# Patient Record
Sex: Male | Born: 1985 | Race: Black or African American | Hispanic: No | Marital: Single | Smoking: Current every day smoker
Health system: Southern US, Community
[De-identification: ages and names within clinical notes are randomized; demographics above are authoritative.]

## PROBLEM LIST (undated history)

## (undated) DIAGNOSIS — I10 Essential (primary) hypertension: Secondary | ICD-10-CM

## (undated) DIAGNOSIS — N19 Unspecified kidney failure: Secondary | ICD-10-CM

---

## 2014-11-13 ENCOUNTER — Emergency Department (HOSPITAL_COMMUNITY): Payer: Medicaid Other

## 2014-11-13 ENCOUNTER — Inpatient Hospital Stay (HOSPITAL_COMMUNITY)
Admission: EM | Admit: 2014-11-13 | Discharge: 2014-11-13 | DRG: 682 | Discharge status: Against Medical Advice | Payer: Medicaid Other | Attending: Family Medicine | Admitting: Family Medicine

## 2014-11-13 ENCOUNTER — Encounter (HOSPITAL_COMMUNITY): Payer: Self-pay | Admitting: *Deleted

## 2014-11-13 DIAGNOSIS — I209 Angina pectoris, unspecified: Secondary | ICD-10-CM

## 2014-11-13 DIAGNOSIS — E871 Hypo-osmolality and hyponatremia: Secondary | ICD-10-CM | POA: Diagnosis not present

## 2014-11-13 DIAGNOSIS — N186 End stage renal disease: Secondary | ICD-10-CM | POA: Diagnosis present

## 2014-11-13 DIAGNOSIS — E875 Hyperkalemia: Secondary | ICD-10-CM | POA: Diagnosis present

## 2014-11-13 DIAGNOSIS — N189 Chronic kidney disease, unspecified: Secondary | ICD-10-CM

## 2014-11-13 DIAGNOSIS — F17210 Nicotine dependence, cigarettes, uncomplicated: Secondary | ICD-10-CM | POA: Diagnosis present

## 2014-11-13 DIAGNOSIS — Z9115 Patient's noncompliance with renal dialysis: Secondary | ICD-10-CM | POA: Diagnosis present

## 2014-11-13 DIAGNOSIS — E8779 Other fluid overload: Secondary | ICD-10-CM

## 2014-11-13 DIAGNOSIS — I120 Hypertensive chronic kidney disease with stage 5 chronic kidney disease or end stage renal disease: Principal | ICD-10-CM | POA: Diagnosis present

## 2014-11-13 DIAGNOSIS — Q261 Persistent left superior vena cava: Secondary | ICD-10-CM | POA: Diagnosis not present

## 2014-11-13 DIAGNOSIS — Z992 Dependence on renal dialysis: Secondary | ICD-10-CM | POA: Diagnosis not present

## 2014-11-13 DIAGNOSIS — N19 Unspecified kidney failure: Secondary | ICD-10-CM

## 2014-11-13 DIAGNOSIS — D649 Anemia, unspecified: Secondary | ICD-10-CM

## 2014-11-13 HISTORY — DX: Unspecified kidney failure: N19

## 2014-11-13 HISTORY — DX: Essential (primary) hypertension: I10

## 2014-11-13 LAB — HEPATIC FUNCTION PANEL
ALBUMIN: 3.6 g/dL (ref 3.5–5.2)
ALT: 19 U/L (ref 0–53)
AST: 25 U/L (ref 0–37)
Alkaline Phosphatase: 1120 U/L — ABNORMAL HIGH (ref 39–117)
BILIRUBIN TOTAL: 0.8 mg/dL (ref 0.3–1.2)
Bilirubin, Direct: 0.1 mg/dL (ref 0.0–0.5)
Indirect Bilirubin: 0.7 mg/dL (ref 0.3–0.9)
Total Protein: 7.3 g/dL (ref 6.0–8.3)

## 2014-11-13 LAB — BRAIN NATRIURETIC PEPTIDE: B Natriuretic Peptide: 3040.1 pg/mL — ABNORMAL HIGH (ref 0.0–100.0)

## 2014-11-13 LAB — BASIC METABOLIC PANEL
Anion gap: 23 — ABNORMAL HIGH (ref 5–15)
BUN: 131 mg/dL — ABNORMAL HIGH (ref 6–23)
CO2: 17 mmol/L — AB (ref 19–32)
Calcium: 9.2 mg/dL (ref 8.4–10.5)
Chloride: 98 mmol/L (ref 96–112)
Creatinine, Ser: 20.97 mg/dL — ABNORMAL HIGH (ref 0.50–1.35)
GFR calc Af Amer: 3 mL/min — ABNORMAL LOW (ref >90)
GFR calc non Af Amer: 2 mL/min — ABNORMAL LOW (ref >90)
GLUCOSE: 93 mg/dL (ref 70–99)
Sodium: 138 mmol/L (ref 135–145)

## 2014-11-13 LAB — I-STAT TROPONIN, ED: Troponin i, poc: 0.09 ng/mL (ref 0.00–0.08)

## 2014-11-13 LAB — CREATININE, SERUM
CREATININE: 14.48 mg/dL — AB (ref 0.50–1.35)
GFR calc Af Amer: 5 mL/min — ABNORMAL LOW (ref >90)
GFR calc non Af Amer: 4 mL/min — ABNORMAL LOW (ref >90)

## 2014-11-13 LAB — PHOSPHORUS
Phosphorus: 9.1 mg/dL — ABNORMAL HIGH (ref 2.3–4.6)
Phosphorus: 6.5 mg/dL — ABNORMAL HIGH (ref 2.3–4.6)

## 2014-11-13 LAB — I-STAT CHEM 8, ED
BUN: 91 mg/dL — AB (ref 6–23)
CREATININE: 15.5 mg/dL — AB (ref 0.50–1.35)
Calcium, Ion: 1.07 mmol/L — ABNORMAL LOW (ref 1.12–1.23)
Chloride: 99 mmol/L (ref 96–112)
GLUCOSE: 111 mg/dL — AB (ref 70–99)
HCT: 30 % — ABNORMAL LOW (ref 39.0–52.0)
Hemoglobin: 10.2 g/dL — ABNORMAL LOW (ref 13.0–17.0)
Potassium: 4 mmol/L (ref 3.5–5.1)
Sodium: 137 mmol/L (ref 135–145)
TCO2: 18 mmol/L (ref 0–100)

## 2014-11-13 LAB — MAGNESIUM
MAGNESIUM: 2.3 mg/dL (ref 1.5–2.5)
Magnesium: 2 mg/dL (ref 1.5–2.5)

## 2014-11-13 LAB — CBC
HCT: 29.8 % — ABNORMAL LOW (ref 39.0–52.0)
HCT: 25.3 % — ABNORMAL LOW (ref 39.0–52.0)
HEMOGLOBIN: 9.4 g/dL — AB (ref 13.0–17.0)
Hemoglobin: 8.3 g/dL — ABNORMAL LOW (ref 13.0–17.0)
MCH: 30.6 pg (ref 26.0–34.0)
MCH: 30.3 pg (ref 26.0–34.0)
MCHC: 31.5 g/dL (ref 30.0–36.0)
MCHC: 32.8 g/dL (ref 30.0–36.0)
MCV: 97.1 fL (ref 78.0–100.0)
MCV: 92.3 fL (ref 78.0–100.0)
PLATELETS: 122 10*3/uL — AB (ref 150–400)
Platelets: 131 10*3/uL — ABNORMAL LOW (ref 150–400)
RBC: 3.07 MIL/uL — AB (ref 4.22–5.81)
RBC: 2.74 MIL/uL — ABNORMAL LOW (ref 4.22–5.81)
RDW: 15.6 % — ABNORMAL HIGH (ref 11.5–15.5)
RDW: 15.6 % — ABNORMAL HIGH (ref 11.5–15.5)
WBC: 5.1 10*3/uL (ref 4.0–10.5)
WBC: 4.2 10*3/uL (ref 4.0–10.5)

## 2014-11-13 LAB — HEPATITIS B SURFACE ANTIGEN: HEP B S AG: NEGATIVE

## 2014-11-13 MED ORDER — LIDOCAINE HCL (PF) 1 % IJ SOLN: 5.0000 mL | INTRAMUSCULAR | Status: DC | PRN

## 2014-11-13 MED ORDER — HEPARIN SODIUM (PORCINE) 1000 UNIT/ML DIALYSIS: 20.0000 [IU]/kg | INTRAMUSCULAR | Status: DC | PRN

## 2014-11-13 MED ORDER — PENTAFLUOROPROP-TETRAFLUOROETH EX AERO: 1.0000 "application " | INHALATION_SPRAY | CUTANEOUS | Status: DC | PRN

## 2014-11-13 MED ORDER — DIPHENHYDRAMINE HCL 25 MG PO CAPS
25.0000 mg | ORAL_CAPSULE | Freq: Four times a day (QID) | ORAL | Status: DC | PRN
Start: 2014-11-13 — End: 2014-11-14
  Filled 2014-11-13: qty 1

## 2014-11-13 MED ORDER — MORPHINE SULFATE 4 MG/ML IJ SOLN
4.0000 mg | Freq: Once | INTRAMUSCULAR | Status: AC
Start: 2014-11-13 — End: 2014-11-13
  Administered 2014-11-13: 4 mg via INTRAVENOUS
  Filled 2014-11-13: qty 1

## 2014-11-13 MED ORDER — LIDOCAINE-PRILOCAINE 2.5-2.5 % EX CREA: 1.0000 "application " | TOPICAL_CREAM | CUTANEOUS | Status: DC | PRN

## 2014-11-13 MED ORDER — ALBUTEROL SULFATE (2.5 MG/3ML) 0.083% IN NEBU
10.0000 mg | INHALATION_SOLUTION | Freq: Once | RESPIRATORY_TRACT | Status: AC
  Administered 2014-11-13: 10 mg via RESPIRATORY_TRACT
  Filled 2014-11-13: qty 12

## 2014-11-13 MED ORDER — DIPHENHYDRAMINE HCL 50 MG/ML IJ SOLN
25.0000 mg | Freq: Once | INTRAMUSCULAR | Status: AC
  Administered 2014-11-13: 25 mg via INTRAVENOUS
  Filled 2014-11-13: qty 1

## 2014-11-13 MED ORDER — SODIUM POLYSTYRENE SULFONATE 15 GM/60ML PO SUSP
30.0000 g | Freq: Once | ORAL | Status: AC
  Administered 2014-11-13: 30 g via ORAL
  Filled 2014-11-13: qty 120

## 2014-11-13 MED ORDER — ASPIRIN 325 MG PO TABS
325.0000 mg | ORAL_TABLET | Freq: Once | ORAL | Status: AC
  Administered 2014-11-13: 325 mg via ORAL
  Filled 2014-11-13: qty 1

## 2014-11-13 MED ORDER — SODIUM CHLORIDE 0.9 % IJ SOLN: 3.0000 mL | Freq: Two times a day (BID) | INTRAMUSCULAR | Status: DC

## 2014-11-13 MED ORDER — SODIUM CHLORIDE 0.9 % IV SOLN: 100.0000 mL | INTRAVENOUS | Status: DC | PRN

## 2014-11-13 MED ORDER — SODIUM CHLORIDE 0.9 % IV SOLN: 250.0000 mL | INTRAVENOUS | Status: DC | PRN

## 2014-11-13 MED ORDER — ONDANSETRON HCL 4 MG/2ML IJ SOLN
4.0000 mg | Freq: Once | INTRAMUSCULAR | Status: AC
  Administered 2014-11-13: 4 mg via INTRAVENOUS
  Filled 2014-11-13: qty 2

## 2014-11-13 MED ORDER — CALCIUM GLUCONATE 10 % IV SOLN: 1.0000 g | Freq: Once | INTRAVENOUS | Status: DC

## 2014-11-13 MED ORDER — HEPARIN SODIUM (PORCINE) 1000 UNIT/ML DIALYSIS: 1000.0000 [IU] | INTRAMUSCULAR | Status: DC | PRN

## 2014-11-13 MED ORDER — SODIUM CHLORIDE 0.9 % IV SOLN
1.0000 g | Freq: Once | INTRAVENOUS | Status: AC
  Administered 2014-11-13: 1 g via INTRAVENOUS
  Filled 2014-11-13: qty 10

## 2014-11-13 MED ORDER — NEPRO/CARBSTEADY PO LIQD: 237.0000 mL | ORAL | Status: DC | PRN

## 2014-11-13 MED ORDER — SODIUM CHLORIDE 0.9 % IJ SOLN: 3.0000 mL | INTRAMUSCULAR | Status: DC | PRN

## 2014-11-13 MED ORDER — INSULIN ASPART 100 UNIT/ML IV SOLN
10.0000 [IU] | Freq: Once | INTRAVENOUS | Status: AC
  Administered 2014-11-13: 10 [IU] via INTRAVENOUS
  Filled 2014-11-13: qty 0.1

## 2014-11-13 MED ORDER — SODIUM CHLORIDE 0.9 % IJ SOLN
3.0000 mL | Freq: Two times a day (BID) | INTRAMUSCULAR | Status: DC
  Administered 2014-11-13: 3 mL via INTRAVENOUS

## 2014-11-13 MED ORDER — DIPHENHYDRAMINE HCL 50 MG/ML IJ SOLN
12.5000 mg | Freq: Once | INTRAMUSCULAR | Status: AC
  Administered 2014-11-13: 12.5 mg via INTRAVENOUS
  Filled 2014-11-13: qty 1

## 2014-11-13 MED ORDER — DEXTROSE 50 % IV SOLN
1.0000 | Freq: Once | INTRAVENOUS | Status: AC
  Administered 2014-11-13: 50 mL via INTRAVENOUS
  Filled 2014-11-13: qty 50

## 2014-11-13 MED ORDER — HEPARIN SODIUM (PORCINE) 5000 UNIT/ML IJ SOLN
5000.0000 [IU] | Freq: Three times a day (TID) | INTRAMUSCULAR | Status: DC
  Filled 2014-11-13: qty 1

## 2014-11-13 NOTE — ED Notes (Signed)
Pt requesting something for itching. Made Pfeiffer MD aware. EDP explained that she is waiting to hear back from Nephrology.   Informed pt of delay in ordering medication.

## 2014-11-13 NOTE — ED Notes (Signed)
Pt reports he dialysis pt x16 years. Last dialysis was Jan 30, pt is suppose to go every Tues, Thurs, and Saturday. Pt does not have reason why he has not been going. Chest pain, cough, and headache x3 days. Pain 10/10. Able to speak in full sentences.

## 2014-11-13 NOTE — ED Notes (Signed)
Pt. Transferred [facility] via [facility]. Uneventful. Pt. Here for dialysis and admission. Pt. Has elevated trop. Of 0.9, potassium. [facility] gave 4 mg morphine iv and zofran iv. Pain level is now tolerable. See [facility] ed notes.

## 2014-11-13 NOTE — Consult Note (Signed)
[place] Elsie Amis 11/13/2014 Arita Miss Requesting Physician:  Cena Benton MD  Reason for Consult:  ESRD, Hyperkalemia, Volume Overload HPI:  29 year old male seen at the request of Dr. Cena Benton for hyperkalemia and this dialysis. He previously was living in [place], [place], and chose to move to [place]. He moved over a week and a half ago and made no plans for transition of hemodialysis care whatsoever. When asked why this took place he has no explanation. He presented to the emergency room and [facility] today complaining of malaise and swelling stating that he "needed dialysis".  He was medically treated for the hyperkalemia which was greater than 7.5 and transferred to the [facility] ER for dialysis.  Treatment records for his outpatient dialysis are not available. He provides very little information. He uses a left upper arm AV fistula. When asked if he plans to stay in [place] he's tells me that he may do it for a year or so.    Filed Weights   11/13/14 0906  Weight: 66 kg (145 lb 8.1 oz)       ROS Balance of 12 systems is negative w/ exceptions as above  Outpt HD Orders Unavailable currently, requested  PMH  Past Medical History  Diagnosis Date  . Dialysis patient   . Hypertension    PSH History reviewed. No pertinent past surgical history. FH History reviewed. No pertinent family history. SH  reports that he has been smoking Cigarettes.  He has a 5 pack-year smoking history. He does not have any smokeless tobacco history on file. He reports that he does not drink alcohol or use illicit drugs. Allergies No Known Allergies Home medications Prior to Admission medications   Medication Sig Start Date End Date Taking? Authorizing Provider  carvedilol (COREG) 25 MG tablet Take 25 mg by mouth 2 (two) times daily with a meal.   Yes Historical Provider, MD  cinacalcet (SENSIPAR) 60 MG tablet Take 60 mg by mouth daily.   Yes Historical Provider, MD  famotidine  (PEPCID) 20 MG tablet Take 20 mg by mouth daily.    Yes Historical Provider, MD  hydrALAZINE (APRESOLINE) 50 MG tablet Take 50 mg by mouth 2 (two) times daily.   Yes Historical Provider, MD  isosorbide dinitrate (ISORDIL) 40 MG tablet Take 40 mg by mouth 2 (two) times daily.   Yes Historical Provider, MD  metoCLOPramide (REGLAN) 5 MG tablet Take 5 mg by mouth 4 (four) times daily -  before meals and at bedtime.   Yes Historical Provider, MD  NIFEdipine (PROCARDIA XL/ADALAT-CC) 60 MG 24 hr tablet Take 60 mg by mouth daily.   Yes Historical Provider, MD  sevelamer (RENAGEL) 800 MG tablet Take 800 mg by mouth 3 (three) times daily with meals.   Yes Historical Provider, MD    Current Medications Scheduled Meds: Continuous Infusions: PRN Meds:.  CBC  Recent Labs Lab 11/13/14 0931  WBC 5.1  HGB 9.4*  HCT 29.8*  MCV 97.1  PLT 131*   Basic Metabolic Panel  Recent Labs Lab 11/13/14 0931 11/13/14 1016  NA 138  --   K >7.5*  --   CL 98  --   CO2 17*  --   GLUCOSE 93  --   BUN 131*  --   CREATININE 20.97*  --   CALCIUM 9.2  --   PHOS  --  9.1*    Physical Exam  Blood pressure 175/92, pulse 94, temperature 97.3 F (36.3 C), temperature source Oral,  resp. rate 23, weight 66 kg (145 lb 8.1 oz), SpO2 100 %. GEN: Mild distress, NRB in palce ENT: NCAT EYES: EOMI, periorbital edema CV: RRR, no rub PULM: coarse bs b/l ABD: s/nt/nd SKIN: no rashes/lesions EXT:2+ pitting LEE LUE AVF + B/T   A 1. ESRD with Uremia 2. HTN and Volume Overload 3. Nonadherence to HD 4. Anemia 5. MBD / 2HPTH / Hyperphosphatemia  P 1. Emergent HD today for volume and hyperkalemia 2. Only 3h Tx given recent missed Tx and concern for dysequilibrium, but need to lower K and remove fluid 3. Need to restart BMD and Anemia management 4. Looks like will need outpt HD in [place], will work to arrange.  Sabra Heckyan [place] MD 11/13/2014, 1:40 PM

## 2014-11-13 NOTE — ED Provider Notes (Signed)
CSN: [id number]     Arrival date & time 11/13/14  0855 History   First MD Initiated Contact with Patient 11/13/14 [phone]     Chief Complaint  Patient presents with  . dialysis pt, chest pain   . Headache     (Consider location/radiation/quality/duration/timing/severity/associated sxs/prior Treatment) HPI The patient reports that he has missed approximately 8 dialysis treatments since January 30. He reports he recently moved to this area and has not established care here. He reports his from [facility] [place] and his nephrologist is a Dr. Gretchen Shorted Phillips. He has been becoming increasingly short of breath. The patient reports that he has developed swelling of his legs, arms and head. He reports he is itching a lot because his phosphorus is elevated and he has a generalized throbbing headache. He reports since yesterday he has developed some constant chest pressure which she describes as being "like the fluid building up". He denies he has had a fever, nausea or vomiting. He has not had syncope or cough. He reports he has not taken any of his medications since the end of January and that he is on many medications. Past Medical History  Diagnosis Date  . Dialysis patient   . Hypertension    History reviewed. No pertinent past surgical history. History reviewed. No pertinent family history. History  Substance Use Topics  . Smoking status: Current Every Day Smoker -- 1.00 packs/day for 5 years    Types: Cigarettes  . Smokeless tobacco: Not on file  . Alcohol Use: No    Review of Systems  10 Systems reviewed and are negative for acute change except as noted in the HPI.   Allergies  Review of patient's allergies indicates no known allergies.  Home Medications   Prior to Admission medications   Medication Sig Start Date End Date Taking? Authorizing Provider  carvedilol (COREG) 25 MG tablet Take 25 mg by mouth 2 (two) times daily with a meal.   Yes Historical Provider, MD  cinacalcet  (SENSIPAR) 60 MG tablet Take 60 mg by mouth daily.   Yes Historical Provider, MD  famotidine (PEPCID) 20 MG tablet Take 20 mg by mouth daily.    Yes Historical Provider, MD  hydrALAZINE (APRESOLINE) 50 MG tablet Take 50 mg by mouth 2 (two) times daily.   Yes Historical Provider, MD  isosorbide dinitrate (ISORDIL) 40 MG tablet Take 40 mg by mouth 2 (two) times daily.   Yes Historical Provider, MD  metoCLOPramide (REGLAN) 5 MG tablet Take 5 mg by mouth 4 (four) times daily -  before meals and at bedtime.   Yes Historical Provider, MD  NIFEdipine (PROCARDIA XL/ADALAT-CC) 60 MG 24 hr tablet Take 60 mg by mouth daily.   Yes Historical Provider, MD  sevelamer (RENAGEL) 800 MG tablet Take 800 mg by mouth 3 (three) times daily with meals.   Yes Historical Provider, MD   BP 172/94 mmHg  Pulse 86  Temp(Src) 98.3 F (36.8 C) (Oral)  Resp 22  Wt 145 lb 8.1 oz (66 kg)  SpO2 95% Physical Exam  Constitutional: He is oriented to person, place, and time. He appears well-developed and well-nourished.  The patient appears mild to moderately short of breath. His mental status is clear. He has a generally edematous appearance. His skin is warm and dry.  HENT:  The patient's head appears generally edematous with puffy edema around his eyes lower face and neck. Examination and palpation of the neck show many dilated veins. The internal oral cavity  is widely patent the tongue does not have appearance of angioedema.  Eyes: EOM are normal. Pupils are equal, round, and reactive to light. Right eye exhibits no discharge. Left eye exhibits no discharge.  Cardiovascular:  Tachycardic. Distant heart sounds.  Pulmonary/Chest:  Mild to moderate work of breathing. Adequate air flow to both lung fields. Occasional expiratory wheeze. Minimal fine rale appreciated  Abdominal: He exhibits distension.  The patient's abdomen is mildly distended and firm. There is are no peritoneal signs present. No definitively appreciable mass.   Musculoskeletal:  Left upper extremity has the patient's fistula. There is a area with thrill and pulse present however is not a largely dilated vessel. There are many firm thrombosed feeling veins. There is 2+ edema of the left upper extremity with pitting on the dorsum of the hand. Bilateral lower extremities have significant skin thinning but no active wounds and 1+ pitting edema.  Neurological: He is alert and oriented to person, place, and time. No cranial nerve deficit. Coordination normal.  Skin: Skin is warm and dry.  Psychiatric: He has a normal mood and affect.    ED Course  Procedures (including critical care time) Labs Review Labs Reviewed  CBC - Abnormal; Notable for the following:    RBC 3.07 (*)    Hemoglobin 9.4 (*)    HCT 29.8 (*)    RDW 15.6 (*)    Platelets 131 (*)    All other components within normal limits  BASIC METABOLIC PANEL - Abnormal; Notable for the following:    Potassium >7.5 (*)    CO2 17 (*)    BUN 131 (*)    Creatinine, Ser 20.97 (*)    GFR calc non Af Amer 2 (*)    GFR calc Af Amer 3 (*)    Anion gap 23 (*)    All other components within normal limits  BRAIN NATRIURETIC PEPTIDE - Abnormal; Notable for the following:    B Natriuretic Peptide 3040.1 (*)    All other components within normal limits  PHOSPHORUS - Abnormal; Notable for the following:    Phosphorus 9.1 (*)    All other components within normal limits  HEPATIC FUNCTION PANEL - Abnormal; Notable for the following:    Alkaline Phosphatase 1120 (*)    All other components within normal limits  I-STAT TROPOININ, ED - Abnormal; Notable for the following:    Troponin i, poc 0.09 (*)    All other components within normal limits  MAGNESIUM    Imaging Review Dg Chest Port 1 View  11/13/2014   CLINICAL DATA:  Chest pain and cough.  Dyspnea.  EXAM: PORTABLE CHEST - 1 VIEW  COMPARISON:  None.  FINDINGS: There is cardiomegaly. There is slight pulmonary vascular congestion. Stents in the  left innominate vein and left subclavian vein.  There is slight ground-glass haziness in the right lung. No effusions. No acute osseous abnormalities.  IMPRESSION: 1. Cardiomegaly with slight pulmonary vascular congestion. 2. Slight diffuse haziness in the right lung which could represent pneumonitis or pulmonary edema.   Electronically Signed   By: Francene Boyers M.D.   On: 11/13/2014 10:30     EKG Interpretation None     Consult Dr. Patience Musca [address], [place], [place] [address]. I was able to speak with the patient's primary nephrologist in [facility]. He reports that that the patient has extremely poor compliance and only comes to a portion of his scheduled dialysis appointments and usually will not stay more  than 2 hours at a time. He does not take his prescribed medications. By report he has been set up with all needed health care including free medications, dialysis and living arrangements. Dr. Vear Clock reports that family members have tried to be supportive and helpful as well, however the patient continues with severe noncompliance. He reports that the large facial swelling observed is chronic in nature due to long-term fluid overload. He reports that at times his overload become so severe that the patient cannot open his eyes due to the swelling. He reports the patient has severe and advanced disease with high risk of sudden death at any time particularly in light of severe noncompliance.  Consult: Dr. Arlean Hopping nephrology. Agrees with current treatment for hyperkalemia request to add oral Kayexalate. The patient is to be transferred for emergent dialysis.  CRITICAL CARE Performed by: Arby Barrette   Total critical care time: 45  Critical care time was exclusive of separately billable procedures and treating other patients.  Critical care was necessary to treat or prevent imminent or life-threatening deterioration.  Critical care was time spent personally by me on the  following activities: development of treatment plan with patient and/or surrogate as well as nursing, discussions with consultants, evaluation of patient's response to treatment, examination of patient, obtaining history from patient or surrogate, ordering and performing treatments and interventions, ordering and review of laboratory studies, ordering and review of radiographic studies, pulse oximetry and re-evaluation of patient's condition. MDM   Final diagnoses:  Chronic renal failure, unspecified stage  Other hypervolemia  Ischemic chest pain  Hyperkalemia   The patient presents as outlined above with fluid overload and hyperkalemia secondary to chronic renal failure with missed dialysis appointments and failure to take any regular medications. He will be transferred to [facility] for emergent dialysis and ongoing treatment.    Arby Barrette, MD 11/13/14 1128

## 2014-11-13 NOTE — Progress Notes (Signed)
At 1445 pt ISTAT k+ result was 4.0.  Pt. Put on a 2K bath.

## 2014-11-13 NOTE — ED Notes (Signed)
Made respiratory aware of neb treatment ordered 

## 2014-11-13 NOTE — ED Notes (Signed)
Admitting md at bedside

## 2014-11-13 NOTE — H&P (Signed)
[facility] History and Physical  Alexander RiggRichard Norlander ZOX:[id number] DOB: 05/08/1986 DOA: 11/13/2014  Referring physician: Dr Donnald GarrePfeiffer PCP: No PCP Per Patient   Chief Complaint: "I need dialysis"  HPI: Alexander [name] is a 29 y.o. male  With history of end-stage renal disease on hemodialysis. Who presents to the ED stating that he needs dialysis. Reportedly patient lives in a different town in [place] and recently moved up here tomorrow where he is currently not set up to obtain dialysis. When asked why he missed dialysis he said no reason in particular. He is complaining of shortness of breath.    While in the ED patient was found to be fluid overloaded and workup would reveal an elevated potassium level of more than 7.5. The patient was given calcium gluconate, albuterol, and insulin. Nephrology was consulted by ED and we were requested to admit the patient to [facility] for further evaluation recommendations.   Review of Systems:  Constitutional:  No weight loss, night sweats, Fevers, chills, fatigue.  HEENT:  No headaches, Difficulty swallowing,Tooth/dental problems,Sore throat,  No sneezing, itching, ear ache, nasal congestion, post nasal drip,  Cardio-vascular:  No chest pain, Orthopnea, PND, swelling in lower extremities, anasarca, dizziness, palpitations  GI:  No heartburn, indigestion, abdominal pain, nausea, vomiting, diarrhea, change in bowel habits, loss of appetite  Resp:  + shortness of breath with exertion or at rest. No excess mucus, no productive cough,+ non-productive cough, No coughing up of blood.No change in color of mucus.No wheezing.No chest wall deformity  Skin:  no rash or lesions.  GU:  no dysuria, change in color of urine, no urgency or frequency. No flank pain.  Musculoskeletal:  No joint pain or swelling. No decreased range of motion. No back pain.  Psych:  No change in mood or affect. No depression or anxiety. No memory loss.   Past Medical  History  Diagnosis Date  . Dialysis patient   . Hypertension    History reviewed. past surgical history: non reported when asked directly. Social History:  reports that he has been smoking Cigarettes.  He has a 5 pack-year smoking history. He does not have any smokeless tobacco history on file. He reports that he does not drink alcohol or use illicit drugs.  No Known Allergies  FAmily history  - non reported when asked directly.  Prior to Admission medications   Not on File   Physical Exam: Filed Vitals:   11/13/14 0906 11/13/14 0914 11/13/14 0915 11/13/14 0942  BP: 172/108 160/96 172/94   Pulse: 90  69 86  Temp: 98.3 F (36.8 C)     TempSrc: Oral     Resp: 16 19 23 22   Weight: 66 kg (145 lb 8.1 oz)     SpO2: 95%   95%    Wt Readings from Last 3 Encounters:  11/13/14 66 kg (145 lb 8.1 oz)    General:  Appears calm and comfortable Eyes: PERRL, normal lids, irises & conjunctiva ENT: grossly normal hearing, lips & tongue Neck: no masses or thyromegaly Cardiovascular: RRR, no m/r/g. + edema Telemetry: SR, no arrhythmias  Respiratory: increased wob, speaks in broken sentences, no wheezes, rhales,  in place Abdomen: soft, nt, nd Skin: no rash or induration seen on limited exam Musculoskeletal: grossly normal tone BUE/BLE Psychiatric: grossly normal mood and affect, speech fluent and appropriate Neurologic: answers questions appropriately moves extremities equally.          Labs on Admission:  Basic Metabolic Panel:  Recent Labs  Lab 11/13/14 0931  NA 138  K >7.5*  CL 98  CO2 17*  GLUCOSE 93  BUN 131*  CREATININE 20.97*  CALCIUM 9.2   Liver Function Tests: No results for input(s): AST, ALT, ALKPHOS, BILITOT, PROT, ALBUMIN in the last 168 hours. No results for input(s): LIPASE, AMYLASE in the last 168 hours. No results for input(s): AMMONIA in the last 168 hours. CBC:  Recent Labs Lab 11/13/14 0931  WBC 5.1  HGB 9.4*  HCT 29.8*  MCV 97.1  PLT 131*     Cardiac Enzymes: No results for input(s): CKTOTAL, CKMB, CKMBINDEX, TROPONINI in the last 168 hours.  BNP (last 3 results)  Recent Labs  11/13/14 0931  BNP 3040.1*    ProBNP (last 3 results) No results for input(s): PROBNP in the last 8760 hours.  CBG: No results for input(s): GLUCAP in the last 168 hours.  Radiological Exams on Admission: Dg Chest Port 1 View  11/13/2014   CLINICAL DATA:  Chest pain and cough.  Dyspnea.  EXAM: PORTABLE CHEST - 1 VIEW  COMPARISON:  None.  FINDINGS: There is cardiomegaly. There is slight pulmonary vascular congestion. Stents in the left innominate vein and left subclavian vein.  There is slight ground-glass haziness in the right lung. No effusions. No acute osseous abnormalities.  IMPRESSION: 1. Cardiomegaly with slight pulmonary vascular congestion. 2. Slight diffuse haziness in the right lung which could represent pneumonitis or pulmonary edema.   Electronically Signed   By: Francene Boyers M.D.   On: 11/13/2014 10:30    EKG: Independently reviewed. Sinus rhythm with no st elevations or depressions peaked t waves  Assessment/Plan Active Problems:   Renal failure/Noncompliance with renal dialysis - Nephrology will be consulted by ED personnel - We'll transfer to [facility] where nephrologist will further assist with case - Patient states that he recently moved up here and does not have his dialysis set up here in [facility]  Hypokalemia -While in the ED patient was given calcium gluconate, albuterol, and insulin - I suspect patient will most likely receive dialysis session today. As mentioned above nephrology assess  Anemia -Chronic disease  Hyponatremia -Hypervolemic hyponatremia. Suspect will resolve once patient has excess fluid removed  Code Status: full DVT Prophylaxis: heparin Family Communication:  Disposition Plan:   Time spent: > 45 minutes  Penny Pia [facility] Pager [phone]

## 2014-11-13 NOTE — ED Notes (Signed)
Pt alert and oriented x4. Respirations even and unlabored, bilateral symmetrical rise and fall of chest. Skin warm and dry. In no acute distress. Denies needs.   

## 2014-11-13 NOTE — ED Notes (Signed)
Pt was brought down here from HD, dialysis was completed, but one hour short.  Pt came of HD early due to cramps and only dialyzed 1.35 liters.  Dialysis states he will need to be dialyzed again tomorrow.  Oxygen mask removed in dialysis.  Pt alert and oriented.  Notified Bedcontrol of need for bed assignment since [facility] prior to transfer.

## 2014-11-13 NOTE — Progress Notes (Signed)
Pt does not have a bed assignment. Have tried to contact ED charge nurse three times and keep getting transferred to the wrong person. Pt. Has signed off of HD 50 minutes early. He began to take bp cuff, 02 off and stand up. He said he cannot do this anymore due to severe leg cramps. He said to rinse him back and take him off the machine. He was trying to get out of the bed and getting very loud. MD notified.

## 2014-11-13 NOTE — ED Notes (Signed)
Pt requesting something to eat.  Made Pfeiffer MD aware.  Pt NPO at this time per EDP.   Made pt aware of diet status at  This time.

## 2014-11-13 NOTE — ED Notes (Signed)
DIET REQUEST CALLED TO SERVICE RESPONSE

## 2014-11-13 NOTE — Procedures (Signed)
I was present at this dialysis session. I have reviewed the session itself and made appropriate changes.   Using AVF, no problems with cannulation.  Pt tolerating procedure well so far. Plan for HD again tomorrow.   Sabra Heckyan Daijha Leggio  MD 11/13/2014, 2:24 PM

## 2014-11-13 NOTE — ED Notes (Signed)
Resp. Called to put pt. On venturi mask.

## 2014-11-14 ENCOUNTER — Encounter (HOSPITAL_COMMUNITY): Payer: Self-pay | Admitting: *Deleted

## 2014-11-14 ENCOUNTER — Inpatient Hospital Stay (HOSPITAL_COMMUNITY)
Admission: EM | Admit: 2014-11-14 | Discharge: 2014-11-19 | DRG: 252 | Disposition: A | Discharge status: Home / Self Care | Payer: Medicaid Other | Attending: Internal Medicine | Admitting: Internal Medicine

## 2014-11-14 ENCOUNTER — Emergency Department (HOSPITAL_COMMUNITY): Payer: Medicaid Other

## 2014-11-14 DIAGNOSIS — I871 Compression of vein: Secondary | ICD-10-CM | POA: Diagnosis present

## 2014-11-14 DIAGNOSIS — Z720 Tobacco use: Secondary | ICD-10-CM

## 2014-11-14 DIAGNOSIS — M7989 Other specified soft tissue disorders: Secondary | ICD-10-CM | POA: Insufficient documentation

## 2014-11-14 DIAGNOSIS — I120 Hypertensive chronic kidney disease with stage 5 chronic kidney disease or end stage renal disease: Secondary | ICD-10-CM | POA: Diagnosis present

## 2014-11-14 DIAGNOSIS — Z9119 Patient's noncompliance with other medical treatment and regimen: Secondary | ICD-10-CM | POA: Diagnosis present

## 2014-11-14 DIAGNOSIS — F1410 Cocaine abuse, uncomplicated: Secondary | ICD-10-CM | POA: Diagnosis present

## 2014-11-14 DIAGNOSIS — R739 Hyperglycemia, unspecified: Secondary | ICD-10-CM | POA: Diagnosis present

## 2014-11-14 DIAGNOSIS — M1610 Unilateral primary osteoarthritis, unspecified hip: Secondary | ICD-10-CM | POA: Diagnosis present

## 2014-11-14 DIAGNOSIS — N2581 Secondary hyperparathyroidism of renal origin: Secondary | ICD-10-CM | POA: Diagnosis present

## 2014-11-14 DIAGNOSIS — D696 Thrombocytopenia, unspecified: Secondary | ICD-10-CM | POA: Diagnosis not present

## 2014-11-14 DIAGNOSIS — R0602 Shortness of breath: Secondary | ICD-10-CM

## 2014-11-14 DIAGNOSIS — M6282 Rhabdomyolysis: Secondary | ICD-10-CM | POA: Diagnosis present

## 2014-11-14 DIAGNOSIS — I10 Essential (primary) hypertension: Secondary | ICD-10-CM | POA: Diagnosis present

## 2014-11-14 DIAGNOSIS — M79602 Pain in left arm: Secondary | ICD-10-CM

## 2014-11-14 DIAGNOSIS — F341 Dysthymic disorder: Secondary | ICD-10-CM | POA: Diagnosis present

## 2014-11-14 DIAGNOSIS — E8779 Other fluid overload: Secondary | ICD-10-CM

## 2014-11-14 DIAGNOSIS — T82858A Stenosis of vascular prosthetic devices, implants and grafts, initial encounter: Principal | ICD-10-CM | POA: Diagnosis present

## 2014-11-14 DIAGNOSIS — D631 Anemia in chronic kidney disease: Secondary | ICD-10-CM | POA: Diagnosis present

## 2014-11-14 DIAGNOSIS — Z992 Dependence on renal dialysis: Secondary | ICD-10-CM

## 2014-11-14 DIAGNOSIS — Z79899 Other long term (current) drug therapy: Secondary | ICD-10-CM

## 2014-11-14 DIAGNOSIS — T82590A Other mechanical complication of surgically created arteriovenous fistula, initial encounter: Secondary | ICD-10-CM | POA: Insufficient documentation

## 2014-11-14 DIAGNOSIS — I420 Dilated cardiomyopathy: Secondary | ICD-10-CM | POA: Diagnosis present

## 2014-11-14 DIAGNOSIS — K219 Gastro-esophageal reflux disease without esophagitis: Secondary | ICD-10-CM | POA: Diagnosis present

## 2014-11-14 DIAGNOSIS — N186 End stage renal disease: Secondary | ICD-10-CM

## 2014-11-14 DIAGNOSIS — E875 Hyperkalemia: Secondary | ICD-10-CM | POA: Diagnosis present

## 2014-11-14 DIAGNOSIS — F1290 Cannabis use, unspecified, uncomplicated: Secondary | ICD-10-CM | POA: Diagnosis present

## 2014-11-14 DIAGNOSIS — G8929 Other chronic pain: Secondary | ICD-10-CM | POA: Diagnosis present

## 2014-11-14 DIAGNOSIS — Y832 Surgical operation with anastomosis, bypass or graft as the cause of abnormal reaction of the patient, or of later complication, without mention of misadventure at the time of the procedure: Secondary | ICD-10-CM | POA: Diagnosis present

## 2014-11-14 DIAGNOSIS — F17210 Nicotine dependence, cigarettes, uncomplicated: Secondary | ICD-10-CM | POA: Diagnosis present

## 2014-11-14 DIAGNOSIS — I5020 Unspecified systolic (congestive) heart failure: Secondary | ICD-10-CM | POA: Diagnosis present

## 2014-11-14 DIAGNOSIS — I728 Aneurysm of other specified arteries: Secondary | ICD-10-CM | POA: Diagnosis present

## 2014-11-14 DIAGNOSIS — N250 Renal osteodystrophy: Secondary | ICD-10-CM | POA: Diagnosis present

## 2014-11-14 DIAGNOSIS — E8770 Fluid overload, unspecified: Secondary | ICD-10-CM | POA: Diagnosis present

## 2014-11-14 HISTORY — DX: Unspecified kidney failure: N19

## 2014-11-14 LAB — BASIC METABOLIC PANEL
Anion gap: 20 — ABNORMAL HIGH (ref 5–15)
BUN: 88 mg/dL — ABNORMAL HIGH (ref 6–23)
CALCIUM: 7.9 mg/dL — AB (ref 8.4–10.5)
CO2: 20 mmol/L (ref 19–32)
Chloride: 99 mmol/L (ref 96–112)
Creatinine, Ser: 16.42 mg/dL — ABNORMAL HIGH (ref 0.50–1.35)
GFR calc Af Amer: 4 mL/min — ABNORMAL LOW (ref >90)
GFR calc non Af Amer: 3 mL/min — ABNORMAL LOW (ref >90)
GLUCOSE: 90 mg/dL (ref 70–99)
POTASSIUM: 5 mmol/L (ref 3.5–5.1)
SODIUM: 139 mmol/L (ref 135–145)

## 2014-11-14 LAB — CBC
HEMATOCRIT: 26.2 % — AB (ref 39.0–52.0)
Hemoglobin: 8.5 g/dL — ABNORMAL LOW (ref 13.0–17.0)
MCH: 30.5 pg (ref 26.0–34.0)
MCHC: 32.4 g/dL (ref 30.0–36.0)
MCV: 93.9 fL (ref 78.0–100.0)
PLATELETS: 121 10*3/uL — AB (ref 150–400)
RBC: 2.79 MIL/uL — ABNORMAL LOW (ref 4.22–5.81)
RDW: 15.6 % — ABNORMAL HIGH (ref 11.5–15.5)
WBC: 3.5 10*3/uL — ABNORMAL LOW (ref 4.0–10.5)

## 2014-11-14 MED ORDER — HYDRALAZINE HCL 50 MG PO TABS
50.0000 mg | ORAL_TABLET | Freq: Two times a day (BID) | ORAL | Status: DC
  Administered 2014-11-15: 50 mg via ORAL
  Filled 2014-11-14 (×4): qty 1

## 2014-11-14 MED ORDER — NEPRO/CARBSTEADY PO LIQD: 237.0000 mL | ORAL | Status: DC | PRN

## 2014-11-14 MED ORDER — SODIUM CHLORIDE 0.9 % IJ SOLN
3.0000 mL | Freq: Two times a day (BID) | INTRAMUSCULAR | Status: DC
  Administered 2014-11-15 – 2014-11-17 (×6): 3 mL via INTRAVENOUS

## 2014-11-14 MED ORDER — DIPHENHYDRAMINE HCL 50 MG/ML IJ SOLN
25.0000 mg | Freq: Once | INTRAMUSCULAR | Status: AC
  Administered 2014-11-14: 25 mg via INTRAVENOUS
  Filled 2014-11-14: qty 1

## 2014-11-14 MED ORDER — HYDROMORPHONE HCL 1 MG/ML IJ SOLN
1.0000 mg | Freq: Once | INTRAMUSCULAR | Status: AC
  Administered 2014-11-14: 1 mg via INTRAVENOUS

## 2014-11-14 MED ORDER — PENTAFLUOROPROP-TETRAFLUOROETH EX AERO: 1.0000 "application " | INHALATION_SPRAY | CUTANEOUS | Status: DC | PRN

## 2014-11-14 MED ORDER — SODIUM CHLORIDE 0.9 % IV SOLN: 100.0000 mL | INTRAVENOUS | Status: DC | PRN

## 2014-11-14 MED ORDER — ACETAMINOPHEN 325 MG PO TABS
650.0000 mg | ORAL_TABLET | Freq: Four times a day (QID) | ORAL | Status: DC | PRN
  Administered 2014-11-18: 650 mg via ORAL
  Filled 2014-11-14 (×2): qty 2

## 2014-11-14 MED ORDER — LIDOCAINE HCL (PF) 1 % IJ SOLN: 5.0000 mL | INTRAMUSCULAR | Status: DC | PRN

## 2014-11-14 MED ORDER — HEPARIN SODIUM (PORCINE) 1000 UNIT/ML DIALYSIS: 2000.0000 [IU] | INTRAMUSCULAR | Status: DC | PRN

## 2014-11-14 MED ORDER — ACETAMINOPHEN 650 MG RE SUPP: 650.0000 mg | Freq: Four times a day (QID) | RECTAL | Status: DC | PRN

## 2014-11-14 MED ORDER — HEPARIN SODIUM (PORCINE) 1000 UNIT/ML DIALYSIS: 2500.0000 [IU] | INTRAMUSCULAR | Status: DC | PRN

## 2014-11-14 MED ORDER — CINACALCET HCL 30 MG PO TABS
60.0000 mg | ORAL_TABLET | Freq: Every day | ORAL | Status: DC
  Administered 2014-11-14 – 2014-11-18 (×4): 60 mg via ORAL
  Filled 2014-11-14 (×6): qty 2

## 2014-11-14 MED ORDER — DIPHENHYDRAMINE HCL 25 MG PO CAPS
25.0000 mg | ORAL_CAPSULE | Freq: Four times a day (QID) | ORAL | Status: DC | PRN
  Administered 2014-11-16 – 2014-11-17 (×4): 25 mg via ORAL
  Filled 2014-11-14 (×5): qty 1

## 2014-11-14 MED ORDER — LIDOCAINE-PRILOCAINE 2.5-2.5 % EX CREA: 1.0000 "application " | TOPICAL_CREAM | CUTANEOUS | Status: DC | PRN

## 2014-11-14 MED ORDER — ALTEPLASE 2 MG IJ SOLR: 2.0000 mg | Freq: Once | INTRAMUSCULAR | Status: DC | PRN

## 2014-11-14 MED ORDER — ISOSORBIDE DINITRATE 20 MG PO TABS
40.0000 mg | ORAL_TABLET | Freq: Two times a day (BID) | ORAL | Status: DC
  Administered 2014-11-15: 40 mg via ORAL
  Filled 2014-11-14 (×4): qty 2

## 2014-11-14 MED ORDER — ONDANSETRON HCL 4 MG/2ML IJ SOLN
4.0000 mg | Freq: Once | INTRAMUSCULAR | Status: AC
  Administered 2014-11-14: 4 mg via INTRAVENOUS
  Filled 2014-11-14: qty 2

## 2014-11-14 MED ORDER — ONDANSETRON HCL 4 MG/2ML IJ SOLN: 4.0000 mg | Freq: Four times a day (QID) | INTRAMUSCULAR | Status: DC | PRN

## 2014-11-14 MED ORDER — DIPHENHYDRAMINE HCL 50 MG/ML IJ SOLN
25.0000 mg | Freq: Once | INTRAMUSCULAR | Status: AC
Start: 2014-11-14 — End: 2014-11-14
  Administered 2014-11-14: 25 mg via INTRAVENOUS

## 2014-11-14 MED ORDER — HYDROMORPHONE HCL 1 MG/ML IJ SOLN
INTRAMUSCULAR | Status: AC
  Filled 2014-11-14: qty 1

## 2014-11-14 MED ORDER — ONDANSETRON HCL 4 MG PO TABS
4.0000 mg | ORAL_TABLET | Freq: Four times a day (QID) | ORAL | Status: DC | PRN
  Filled 2014-11-14: qty 1

## 2014-11-14 MED ORDER — HYDROMORPHONE HCL 1 MG/ML IJ SOLN
1.0000 mg | Freq: Once | INTRAMUSCULAR | Status: AC
  Administered 2014-11-14: 1 mg via INTRAVENOUS
  Filled 2014-11-14: qty 1

## 2014-11-14 MED ORDER — HYDROCODONE-ACETAMINOPHEN 5-325 MG PO TABS
1.0000 | ORAL_TABLET | Freq: Three times a day (TID) | ORAL | Status: DC | PRN
Start: 2014-11-14 — End: 2014-11-19
  Administered 2014-11-15: 1 via ORAL
  Administered 2014-11-15: 2 via ORAL
  Administered 2014-11-16 (×2): 1 via ORAL
  Administered 2014-11-17: 2 via ORAL
  Filled 2014-11-14 (×4): qty 2

## 2014-11-14 MED ORDER — CARVEDILOL 25 MG PO TABS
25.0000 mg | ORAL_TABLET | Freq: Two times a day (BID) | ORAL | Status: DC
  Administered 2014-11-14: 25 mg via ORAL
  Filled 2014-11-14 (×4): qty 1

## 2014-11-14 MED ORDER — DIPHENHYDRAMINE HCL 50 MG/ML IJ SOLN
INTRAMUSCULAR | Status: AC
  Administered 2014-11-14: 25 mg via INTRAVENOUS
  Filled 2014-11-14: qty 1

## 2014-11-14 MED ORDER — ENOXAPARIN SODIUM 30 MG/0.3ML ~~LOC~~ SOLN
30.0000 mg | SUBCUTANEOUS | Status: DC
  Administered 2014-11-14 – 2014-11-17 (×4): 30 mg via SUBCUTANEOUS
  Filled 2014-11-14 (×6): qty 0.3

## 2014-11-14 MED ORDER — HEPARIN SODIUM (PORCINE) 1000 UNIT/ML DIALYSIS: 1000.0000 [IU] | INTRAMUSCULAR | Status: DC | PRN

## 2014-11-14 MED ORDER — ALTEPLASE 2 MG IJ SOLR: 2.0000 mg | Freq: Once | INTRAMUSCULAR | Status: AC | PRN

## 2014-11-14 MED ORDER — METOCLOPRAMIDE HCL 5 MG PO TABS
5.0000 mg | ORAL_TABLET | Freq: Three times a day (TID) | ORAL | Status: DC
  Administered 2014-11-15 – 2014-11-19 (×13): 5 mg via ORAL
  Filled 2014-11-14 (×23): qty 1

## 2014-11-14 MED ORDER — GABAPENTIN 600 MG PO TABS
300.0000 mg | ORAL_TABLET | Freq: Every day | ORAL | Status: DC
  Filled 2014-11-14 (×2): qty 0.5

## 2014-11-14 MED ORDER — CINACALCET HCL 30 MG PO TABS: 60.0000 mg | ORAL_TABLET | Freq: Every day | ORAL | Status: DC

## 2014-11-14 MED ORDER — SODIUM CHLORIDE 0.9 % IJ SOLN: 3.0000 mL | INTRAMUSCULAR | Status: DC | PRN

## 2014-11-14 MED ORDER — SODIUM CHLORIDE 0.9 % IV SOLN: 250.0000 mL | INTRAVENOUS | Status: DC | PRN

## 2014-11-14 MED ORDER — SEVELAMER CARBONATE 800 MG PO TABS
800.0000 mg | ORAL_TABLET | Freq: Three times a day (TID) | ORAL | Status: DC
  Administered 2014-11-15 – 2014-11-19 (×6): 800 mg via ORAL
  Filled 2014-11-14 (×16): qty 1

## 2014-11-14 MED ORDER — NIFEDIPINE ER 60 MG PO TB24
60.0000 mg | ORAL_TABLET | Freq: Every day | ORAL | Status: DC
  Administered 2014-11-14: 60 mg via ORAL
  Filled 2014-11-14 (×2): qty 1

## 2014-11-14 MED ORDER — ISOSORBIDE DINITRATE 20 MG PO TABS: 40.0000 mg | ORAL_TABLET | Freq: Two times a day (BID) | ORAL | Status: DC

## 2014-11-14 MED ORDER — FAMOTIDINE 20 MG PO TABS
20.0000 mg | ORAL_TABLET | Freq: Every day | ORAL | Status: DC
  Administered 2014-11-15 – 2014-11-19 (×5): 20 mg via ORAL
  Filled 2014-11-14 (×5): qty 1

## 2014-11-14 NOTE — Progress Notes (Signed)
 [facility] Progress Note   Subjective: patient was here yesterday for HD, he had 3 hours HD and signed off 50 min early due to cramping. Returns today w c/o severe swelling, some SOB, itching. Last HD in [place] on 1/30. Says he is "not going back" to [facility], wants to do HD here, living with his brother now in [place].    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  Filed Vitals:   11/14/14 1257 11/14/14 1353 11/14/14 1415 11/14/14 1520  BP: 149/81 169/111 160/101 160/95  Pulse: 97  90 100  Temp: 98 F (36.7 C)     TempSrc: Oral     Resp: 20  21 20   SpO2: 97%  98% 90%   Exam: Alert small framed AAM , diffusely swollen about the face, arms, legs; looks uncomfortable +JVD Chest mostly clear , rare rales R base, no wheezing, no ^WOB RRR tachy, no MRG, no rub Abd soft, NTND, + BS, no ascites GU normal male Diffuse 2-3 + edema of arms and legs LUA AVG +bruit Neuro is awake, alert, nonfocal  HD: [facility] TTS 3.5h   F180   400/800    62.5kg    2/2.25 Bath  LUA AVG (Accuseal)  Heparin 4600       Assessment: 1. Volume overload, marked , without resp distress 2. ESRD on HD - last HD in [place] 10 days ago, patient says he is "not going back", wants to do HD here in [place] 3. Hx systolic CHF / dilated CM 4. HTN on coreg, hydralazine and procardia at home. BP's up 5. 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 Anemia Hb 8.5, don't see any ESA orders on OP hemo sheets 7. Hx substance abuse - did not go into this today w pt  Plan- HD tonight upstairs and again tomorrow, max UF. Check fe/tibc. Start CLIP process.     Vinson Moselleob Noga Fogg MD  pager [phone]    cell [phone]  11/14/2014, 3:41 PM     Recent Labs Lab 11/13/14 0931 11/13/14 1016 11/13/14 1511 11/13/14 1714 11/14/14 1259  NA 138  --  137  --  139  K >7.5*  --  4.0  --  5.0  CL 98  --  99  --  99  CO2 17*  --   --   --  20  GLUCOSE 93  --  111*  --  90  BUN 131*  --  91*  --  88*  CREATININE 20.97*  --  15.50* 14.48* 16.42*  CALCIUM 9.2  --   --   --  7.9*  PHOS  --  9.1*  --  6.5*  --     Recent Labs Lab 11/13/14 1016  AST 25  ALT 19  ALKPHOS 1120*  BILITOT 0.8  PROT 7.3  ALBUMIN 3.6    Recent Labs Lab 11/13/14 0931 11/13/14 1511 11/13/14 1714 11/14/14 1259  WBC 5.1  --  4.2 3.5*  HGB 9.4* 10.2* 8.3* 8.5*  HCT 29.8* 30.0* 25.3* 26.2*  MCV 97.1  --  92.3 93.9  PLT 131*  --  122* 121*

## 2014-11-14 NOTE — ED Notes (Signed)
Report given to Nicole, RN

## 2014-11-14 NOTE — H&P (Signed)
History and Physical  Alexander RiggRichard Sheets WJX:[id number] DOB: 10/29/1985 DOA: 11/14/2014  Referring physician: Dorthy Coolerhris Paulina, ER physician PCP: No PCP Per Patient   Chief Complaint: Weakness and swelling  HPI: Alexander [name] is a 29 y.o. male  With past mental history of childhood renal disease who is been on dialysis now for some time. Patient was previously living in [place] [facility] [place] and moved to [place] in with his brother. He somewhat vague on the details, although it sounds like there were some social issues that led to this. Patient moved here on the first and had not yet had a chance to establish with a dialysis center here. He started feeling very swollen and short of breath and came to the emergency room on 2/9. At that time he received pressure dialysis and was given a referral for outpatient dialysis. He came back in for a very weak and swollen. After being evaluated by nephrology, was recommended he come in for formal full dialysis. Lab work noted a mild hyperkalemia and hyperphosphatemia without any EKG changes. Vital signs are stable.   Review of Systems:  Patient seen in emergency room. 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  States that breathing is a little bit rough without cough. Feels somewhat short of breath. Feels swollen.  Pt denies any headaches, vision changes, dysphagia, chest pain, palpitations, wheezing, abdominal pain, hematuria, dysuria, constipation, diarrhea, focal extremity numbness or weakness or pain other than described above.  Review of systems are otherwise negative  Past Medical History  Diagnosis Date  . Hypertension   . ESRD on hemodialysis 11/13/2014    Per patient had childhood kidney disease, not sure etiology.  Around age 29 was started on RRT with PD for a short time, then changed to HD which he says he has been doing for about 16 years.  Has a LUA AVF as of Feb 2016.      History  reviewed. No pertinent past surgical history. Social History:  reports that he has been smoking Cigarettes.  He has a 5 pack-year smoking history. He does not have any smokeless tobacco history on file. He reports that he does not drink alcohol or use illicit drugs. Patient lives with his brother currently. He is normally able to ambulate without assistance  No Known Allergies  Family history: As discussed with patient, no medical problems as far as he knows  Prior to Admission medications   Medication Sig Start Date End Date Taking? Authorizing Provider  carvedilol (COREG) 25 MG tablet Take 25 mg by mouth 2 (two) times daily with a meal.   Yes Historical Provider, MD  cinacalcet (SENSIPAR) 60 MG tablet Take 60 mg by mouth daily.   Yes Historical Provider, MD  famotidine (PEPCID) 20 MG tablet Take 20 mg by mouth daily.    Yes Historical Provider, MD  hydrALAZINE (APRESOLINE) 50 MG tablet Take 50 mg by mouth 2 (two) times daily.   Yes Historical Provider, MD  isosorbide dinitrate (ISORDIL) 40 MG tablet Take 40 mg by mouth 2 (two) times daily.   Yes Historical Provider, MD  metoCLOPramide (REGLAN) 5 MG tablet Take 5 mg by mouth 4 (four) times daily -  before meals and at bedtime.   Yes Historical Provider, MD  NIFEdipine (PROCARDIA XL/ADALAT-CC) 60 MG 24 hr tablet Take 60 mg by mouth daily.   Yes Historical Provider, MD  sevelamer (RENAGEL) 800 MG tablet Take 800 mg by mouth 3 (three)  times daily with meals.   Yes Historical Provider, MD    Physical Exam: BP 159/111 mmHg  Pulse 102  Temp(Src) 98 F (36.7 C) (Oral)  Resp 20  SpO2 95%  General:  Alert and oriented 3, mildly depressed Eyes: Sclera nonicteric, extraocular movements are intact ENT: Normocephalic, atraumatic, mucous members are moist Neck: No carotid bruits Cardiovascular: Regular rate and rhythm, S1-S2, borderline tachycardia Respiratory: Clear to auscultation bilaterally Abdomen: Soft, nontender, nondistended, positive  bowel sounds Skin: No skin breaks, tears or lesions. Musculoskeletal: No clubbing or cyanosis, 2+ pitting edema in all extremities, left upper extremity fistula noted with significant swelling-chronic Psychiatric: Patient emotional, otherwise appropriate, no signs of psychoses Neurologic: No focal deficits           Labs on Admission:  Basic Metabolic Panel:  Recent Labs Lab 11/13/14 0931 11/13/14 1016 11/13/14 1511 11/13/14 1714 11/14/14 1259  NA 138  --  137  --  139  K >7.5*  --  4.0  --  5.0  CL 98  --  99  --  99  CO2 17*  --   --   --  20  GLUCOSE 93  --  111*  --  90  BUN 131*  --  91*  --  88*  CREATININE 20.97*  --  15.50* 14.48* 16.42*  CALCIUM 9.2  --   --   --  7.9*  MG  --  2.3  --  2.0  --   PHOS  --  9.1*  --  6.5*  --    Liver Function Tests:  Recent Labs Lab 11/13/14 1016  AST 25  ALT 19  ALKPHOS 1120*  BILITOT 0.8  PROT 7.3  ALBUMIN 3.6   No results for input(s): LIPASE, AMYLASE in the last 168 hours. No results for input(s): AMMONIA in the last 168 hours. CBC:  Recent Labs Lab 11/13/14 0931 11/13/14 1511 11/13/14 1714 11/14/14 1259  WBC 5.1  --  4.2 3.5*  HGB 9.4* 10.2* 8.3* 8.5*  HCT 29.8* 30.0* 25.3* 26.2*  MCV 97.1  --  92.3 93.9  PLT 131*  --  122* 121*   Cardiac Enzymes: No results for input(s): CKTOTAL, CKMB, CKMBINDEX, TROPONINI in the last 168 hours.  BNP (last 3 results)  Recent Labs  11/13/14 0931  BNP 3040.1*    ProBNP (last 3 results) No results for input(s): PROBNP in the last 8760 hours.  CBG: No results for input(s): GLUCAP in the last 168 hours.  Radiological Exams on Admission: Dg Chest 2 View  11/14/2014   CLINICAL DATA:  Left-sided chest pain for 3 days, history of smoking  EXAM: CHEST  2 VIEW  COMPARISON:  11/13/2014  FINDINGS: Cardiac shadow remains enlarged. Left subclavian and innominate vein stenting is again identified. A persistent right basilar infiltrate is seen. No new focal infiltrates are  noted. No acute bony abnormality is seen.  IMPRESSION: Persistent right basilar infiltrate.   Electronically Signed   By: Alcide Clever M.D.   On: 11/14/2014 14:44   Dg Chest Port 1 View  11/13/2014   CLINICAL DATA:  Chest pain and cough.  Dyspnea.  EXAM: PORTABLE CHEST - 1 VIEW  COMPARISON:  None.  FINDINGS: There is cardiomegaly. There is slight pulmonary vascular congestion. Stents in the left innominate vein and left subclavian vein.  There is slight ground-glass haziness in the right lung. No effusions. No acute osseous abnormalities.  IMPRESSION: 1. Cardiomegaly with slight pulmonary vascular congestion. 2. Slight  diffuse haziness in the right lung which could represent pneumonitis or pulmonary edema.   Electronically Signed   By: Francene Boyers M.D.   On: 11/13/2014 10:30    EKG: Independently reviewed. Nonspecific T-wave changes. Questionable intraventricular conduction delay  Assessment/Plan Present on Admission:  . Volume overload secondary to end-stage renal disease on hemodialysis: Nephrology to take patient for dialysis tonight for full dialysis session. Will need patient to be set up with outpatient dialysis and plan schedule before discharge  . Hypertension: Continue home meds  . Tobacco abuse: Patient declined nicotine patch counseled   . Chronic pain of left upper extremity: In revision of left upper from the fistula, patient reportedly had some surgical nerve damage-this was done years ago at an outpatient hospital. try to minimize narcotics. Refer patient for outpatient pain clinic:   Hyperphosphatemia/Hyperkalemia: Mild. No signs of EKG changes.will improve with dialysis.    Consultants: Nephrology   Code Status: Full code   Family Communication: Left message with brother   Disposition Plan: Likely home tomorrow once fully dialyzed and outpatient dialysis set up   Time spent: 35 minutes   Hollice Espy [facility] Pager [phone]

## 2014-11-14 NOTE — ED Notes (Signed)
Dr. Pollina at the bedside.  

## 2014-11-14 NOTE — ED Notes (Signed)
Pt began cursing and getting very angry rt not receiving IV Benadryl. Pt said "f#$* this I'm going home, gonna make me stay here all d&*# day". Dialysis called and pt continued to refuse to stay. Pt continued to curse at nursing staff and became more and more agitated. Burna MortimerWanda, RN in with pt now and he continues to raise his voice.

## 2014-11-14 NOTE — ED Notes (Signed)
Meal tray ordered for pt  

## 2014-11-14 NOTE — Progress Notes (Signed)
Hemodialysis- Called for report on pt but was informed that pt had decided to leave AMA per RN.

## 2014-11-14 NOTE — ED Notes (Signed)
Also advised pt that we will try to get and order for benadryl from his doctor. Asked pt why he left against medical advice and he states "the same thing that is going on tonite, aint nothing getting done like i want it to." explained to him that because he left [facility]a yesterday and did not stay for dialysis today he is having to start over like a new patient. Also advised him that we are working to get him admitted.

## 2014-11-14 NOTE — ED Notes (Signed)
Patient appears very edematous. His face, arms hands feet legs etc are very swollen. He denies cp. States some sob with activity

## 2014-11-14 NOTE — ED Notes (Signed)
Dr Juel Burrowlin with nephrology notified pt wanting to leave [facility]

## 2014-11-14 NOTE — Progress Notes (Signed)
Hemodialysis- Pt medicated for pain and itching. Dr. Juel BurrowLin notified of pt access arm severely swollen. Pt states "Its the fluid, but I have trouble with that arm, especially lately." Continue to monitor patient

## 2014-11-14 NOTE — ED Notes (Signed)
Called for triage x2 no answer

## 2014-11-14 NOTE — ED Provider Notes (Signed)
CSN: [id number]     Arrival date & time 11/14/14  1227 History   First MD Initiated Contact with Patient 11/14/14 1349     Chief Complaint  Patient presents with  . Shortness of Breath     (Consider location/radiation/quality/duration/timing/severity/associated sxs/prior Treatment) HPI Comments: Patient presents to the ER for evaluation of shortness of breath. Patient reports that he is a dialysis patient. He has recently moved to this area to live with his brother. Patient reports that he normally gets dialysis Tuesday Thursday Saturday, but he went without dialysis for 9 days. He was seen in the ER yesterday and had partial dialysis, but does not have an established ongoing center. He reports that he still feels very short of breath today and has at least 20 pounds of weight gain. Patient reports that he is aching all over.  Patient is a 29 y.o. male presenting with shortness of breath.  Shortness of Breath   Past Medical History  Diagnosis Date  . Dialysis patient   . Hypertension    History reviewed. No pertinent past surgical history. History reviewed. No pertinent family history. History  Substance Use Topics  . Smoking status: Current Every Day Smoker -- 1.00 packs/day for 5 years    Types: Cigarettes  . Smokeless tobacco: Not on file  . Alcohol Use: No    Review of Systems  Respiratory: Positive for shortness of breath.   Musculoskeletal: Positive for myalgias and arthralgias.  All other systems reviewed and are negative.     Allergies  Review of patient's allergies indicates no known allergies.  Home Medications   Prior to Admission medications   Medication Sig Start Date End Date Taking? Authorizing Provider  carvedilol (COREG) 25 MG tablet Take 25 mg by mouth 2 (two) times daily with a meal.   Yes Historical Provider, MD  cinacalcet (SENSIPAR) 60 MG tablet Take 60 mg by mouth daily.   Yes Historical Provider, MD  famotidine (PEPCID) 20 MG tablet Take 20 mg  by mouth daily.    Yes Historical Provider, MD  hydrALAZINE (APRESOLINE) 50 MG tablet Take 50 mg by mouth 2 (two) times daily.   Yes Historical Provider, MD  isosorbide dinitrate (ISORDIL) 40 MG tablet Take 40 mg by mouth 2 (two) times daily.   Yes Historical Provider, MD  metoCLOPramide (REGLAN) 5 MG tablet Take 5 mg by mouth 4 (four) times daily -  before meals and at bedtime.   Yes Historical Provider, MD  NIFEdipine (PROCARDIA XL/ADALAT-CC) 60 MG 24 hr tablet Take 60 mg by mouth daily.   Yes Historical Provider, MD  sevelamer (RENAGEL) 800 MG tablet Take 800 mg by mouth 3 (three) times daily with meals.   Yes Historical Provider, MD   BP 160/95 mmHg  Pulse 100  Temp(Src) 98 F (36.7 C) (Oral)  Resp 20  SpO2 90% Physical Exam  Constitutional: He is oriented to person, place, and time. He appears well-developed and well-nourished. No distress.  HENT:  Head: Normocephalic and atraumatic.  Right Ear: Hearing normal.  Left Ear: Hearing normal.  Nose: Nose normal.  Mouth/Throat: Oropharynx is clear and moist and mucous membranes are normal.  Eyes: Conjunctivae and EOM are normal. Pupils are equal, round, and reactive to light.  Neck: Normal range of motion. Neck supple.  Cardiovascular: Regular rhythm, S1 normal and S2 normal.  Exam reveals no gallop and no friction rub.   No murmur heard. Pulmonary/Chest: Effort normal and breath sounds normal. No respiratory distress. He exhibits  no tenderness.  Abdominal: Soft. Normal appearance and bowel sounds are normal. There is no hepatosplenomegaly. There is no tenderness. There is no rebound, no guarding, no tenderness at McBurney's point and negative Murphy's sign. No hernia.  Musculoskeletal: Normal range of motion. He exhibits edema (diffuse pitting edema all 4 extremities).  Neurological: He is alert and oriented to person, place, and time. He has normal strength. No cranial nerve deficit or sensory deficit. Coordination normal. GCS eye  subscore is 4. GCS verbal subscore is 5. GCS motor subscore is 6.  Skin: Skin is warm, dry and intact. No rash noted. No cyanosis.  Psychiatric: He has a normal mood and affect. His speech is normal and behavior is normal. Thought content normal.  Nursing note and vitals reviewed.   ED Course  Procedures (including critical care time) Labs Review Labs Reviewed  CBC - Abnormal; Notable for the following:    WBC 3.5 (*)    RBC 2.79 (*)    Hemoglobin 8.5 (*)    HCT 26.2 (*)    RDW 15.6 (*)    Platelets 121 (*)    All other components within normal limits  BASIC METABOLIC PANEL - Abnormal; Notable for the following:    BUN 88 (*)    Creatinine, Ser 16.42 (*)    Calcium 7.9 (*)    GFR calc non Af Amer 3 (*)    GFR calc Af Amer 4 (*)    Anion gap 20 (*)    All other components within normal limits    Imaging Review Dg Chest 2 View  11/14/2014   CLINICAL DATA:  Left-sided chest pain for 3 days, history of smoking  EXAM: CHEST  2 VIEW  COMPARISON:  11/13/2014  FINDINGS: Cardiac shadow remains enlarged. Left subclavian and innominate vein stenting is again identified. A persistent right basilar infiltrate is seen. No new focal infiltrates are noted. No acute bony abnormality is seen.  IMPRESSION: Persistent right basilar infiltrate.   Electronically Signed   By: Alcide Clever M.D.   On: 11/14/2014 14:44   Dg Chest Port 1 View  11/13/2014   CLINICAL DATA:  Chest pain and cough.  Dyspnea.  EXAM: PORTABLE CHEST - 1 VIEW  COMPARISON:  None.  FINDINGS: There is cardiomegaly. There is slight pulmonary vascular congestion. Stents in the left innominate vein and left subclavian vein.  There is slight ground-glass haziness in the right lung. No effusions. No acute osseous abnormalities.  IMPRESSION: 1. Cardiomegaly with slight pulmonary vascular congestion. 2. Slight diffuse haziness in the right lung which could represent pneumonitis or pulmonary edema.   Electronically Signed   By: Francene Boyers M.D.    On: 11/13/2014 10:30     EKG Interpretation   Date/Time:  Wednesday November 14 2014 13:00:18 EST Ventricular Rate:  100 PR Interval:  194 QRS Duration: 100 QT Interval:  380 QTC Calculation: 490 R Axis:   109 Text Interpretation:  Normal sinus rhythm Rightward axis Prolonged QT  Abnormal ECG No significant change since last tracing Confirmed by Gjon Letarte   MD, Lemuel Boodram [phone]) on 11/14/2014 2:08:02 PM      MDM   Final diagnoses:  None   volume overload secondary to end-stage renal disease  Patient resents to the ER for evaluation of continued shortness of breath and swelling. Patient is a dialysis patient, but went 9 days without dialysis. He did have a partial dialysis yesterday, but did not have relief of his significant swelling, shortness of breath, aching  pain secondary to the swelling. Lab work is much improved today, no hyperkalemia noted. Chest x-ray does show evidence of volume overload. EKG does not show any changes from yesterday.  Patient has been seen by Dr. Arlean HoppingSchertz, who recommends admission to medicine and will dialyze patient.   Gilda Creasehristopher J. Laquinta Hazell, MD 11/14/14 [phone]

## 2014-11-14 NOTE — ED Notes (Signed)
Patient has finished his dinner and is now requesting another dinner tray. Explained to pt that we have to monitor his intake closely because of his volume overload that is being treated. He states he understands but wants to talk with his doctor to ask for double portions at meal times

## 2014-11-14 NOTE — Progress Notes (Addendum)
CM consulted to assist with finding an outpatient HD center. ED CM reviewed record, and  met with patient at bedside. Patient speaking in a raised voice, stating, "I just want IV Benadryl that the Doctor wrote for me", Explained that I would speak with Elmyra Ricks RN caring for patient. Order was placed for PO benadryl, confirmed with Dr.Krishnan. Spoke with patient regarding the order for PO benadryl, he verbalized understanding and is agreeable with plan for HD tx tonight.   Patient reports last HD in [place] 10 days ago, patient says he is "not going back", wants to do HD here in [place].  Patient is being dialyzed here a [facility]. CM will contact [place] at [facility] about assisting patient with finding a OP HD center.  Patient will be admitted to [facility] after dialysis tonight.  Unit CM will continue to follow for final disposition plan.

## 2014-11-14 NOTE — ED Notes (Signed)
Called dialysis to be sure they were aware of him. They advise should be before 8 pm tonight.

## 2014-11-14 NOTE — ED Notes (Signed)
The patient has refused tylenol for pain.  I offered him hydrocodone/acetaminophen and he said "that shit ain't gonna work"!  He asked to see Burna MortimerWanda, RN case manager and the doctor that is admitting him.  He says he wants IV pain medication because that is what works for him. I had the secretary page the admitting doctor.

## 2014-11-14 NOTE — ED Notes (Addendum)
Pt was seen here yesterday for same. Reports missing several dialysis treatments, came here yesterday and had dialysis but still having swelling and sob. Moderate swelling noted to face and arms. Airway intact at this time, spo2 97%.

## 2014-11-15 ENCOUNTER — Observation Stay (HOSPITAL_COMMUNITY): Payer: Medicaid Other

## 2014-11-15 DIAGNOSIS — D631 Anemia in chronic kidney disease: Secondary | ICD-10-CM | POA: Diagnosis present

## 2014-11-15 DIAGNOSIS — G8929 Other chronic pain: Secondary | ICD-10-CM | POA: Diagnosis present

## 2014-11-15 DIAGNOSIS — F341 Dysthymic disorder: Secondary | ICD-10-CM | POA: Diagnosis present

## 2014-11-15 DIAGNOSIS — T82858A Stenosis of vascular prosthetic devices, implants and grafts, initial encounter: Secondary | ICD-10-CM | POA: Diagnosis present

## 2014-11-15 DIAGNOSIS — Z79899 Other long term (current) drug therapy: Secondary | ICD-10-CM | POA: Diagnosis not present

## 2014-11-15 DIAGNOSIS — I420 Dilated cardiomyopathy: Secondary | ICD-10-CM | POA: Diagnosis present

## 2014-11-15 DIAGNOSIS — Z9119 Patient's noncompliance with other medical treatment and regimen: Secondary | ICD-10-CM | POA: Diagnosis present

## 2014-11-15 DIAGNOSIS — E8779 Other fluid overload: Secondary | ICD-10-CM | POA: Diagnosis not present

## 2014-11-15 DIAGNOSIS — F17210 Nicotine dependence, cigarettes, uncomplicated: Secondary | ICD-10-CM | POA: Diagnosis present

## 2014-11-15 DIAGNOSIS — Z992 Dependence on renal dialysis: Secondary | ICD-10-CM | POA: Diagnosis not present

## 2014-11-15 DIAGNOSIS — N189 Chronic kidney disease, unspecified: Secondary | ICD-10-CM | POA: Diagnosis not present

## 2014-11-15 DIAGNOSIS — N250 Renal osteodystrophy: Secondary | ICD-10-CM | POA: Diagnosis present

## 2014-11-15 DIAGNOSIS — M1610 Unilateral primary osteoarthritis, unspecified hip: Secondary | ICD-10-CM | POA: Diagnosis present

## 2014-11-15 DIAGNOSIS — M7989 Other specified soft tissue disorders: Secondary | ICD-10-CM | POA: Diagnosis present

## 2014-11-15 DIAGNOSIS — R739 Hyperglycemia, unspecified: Secondary | ICD-10-CM | POA: Diagnosis present

## 2014-11-15 DIAGNOSIS — Q261 Persistent left superior vena cava: Secondary | ICD-10-CM | POA: Diagnosis not present

## 2014-11-15 DIAGNOSIS — I5020 Unspecified systolic (congestive) heart failure: Secondary | ICD-10-CM | POA: Diagnosis present

## 2014-11-15 DIAGNOSIS — N2581 Secondary hyperparathyroidism of renal origin: Secondary | ICD-10-CM | POA: Diagnosis present

## 2014-11-15 DIAGNOSIS — E875 Hyperkalemia: Secondary | ICD-10-CM | POA: Diagnosis present

## 2014-11-15 DIAGNOSIS — I10 Essential (primary) hypertension: Secondary | ICD-10-CM | POA: Diagnosis not present

## 2014-11-15 DIAGNOSIS — F1410 Cocaine abuse, uncomplicated: Secondary | ICD-10-CM | POA: Diagnosis present

## 2014-11-15 DIAGNOSIS — Y832 Surgical operation with anastomosis, bypass or graft as the cause of abnormal reaction of the patient, or of later complication, without mention of misadventure at the time of the procedure: Secondary | ICD-10-CM | POA: Diagnosis present

## 2014-11-15 DIAGNOSIS — F1290 Cannabis use, unspecified, uncomplicated: Secondary | ICD-10-CM | POA: Diagnosis present

## 2014-11-15 DIAGNOSIS — E871 Hypo-osmolality and hyponatremia: Secondary | ICD-10-CM | POA: Diagnosis not present

## 2014-11-15 DIAGNOSIS — D696 Thrombocytopenia, unspecified: Secondary | ICD-10-CM | POA: Diagnosis not present

## 2014-11-15 DIAGNOSIS — N186 End stage renal disease: Secondary | ICD-10-CM | POA: Diagnosis present

## 2014-11-15 DIAGNOSIS — M6282 Rhabdomyolysis: Secondary | ICD-10-CM | POA: Diagnosis present

## 2014-11-15 DIAGNOSIS — K219 Gastro-esophageal reflux disease without esophagitis: Secondary | ICD-10-CM | POA: Diagnosis present

## 2014-11-15 DIAGNOSIS — I120 Hypertensive chronic kidney disease with stage 5 chronic kidney disease or end stage renal disease: Secondary | ICD-10-CM | POA: Diagnosis present

## 2014-11-15 DIAGNOSIS — I728 Aneurysm of other specified arteries: Secondary | ICD-10-CM | POA: Diagnosis present

## 2014-11-15 DIAGNOSIS — M79602 Pain in left arm: Secondary | ICD-10-CM | POA: Diagnosis not present

## 2014-11-15 DIAGNOSIS — I871 Compression of vein: Secondary | ICD-10-CM | POA: Diagnosis present

## 2014-11-15 LAB — CBC
HEMATOCRIT: 26.2 % — AB (ref 39.0–52.0)
Hemoglobin: 8.3 g/dL — ABNORMAL LOW (ref 13.0–17.0)
MCH: 29.6 pg (ref 26.0–34.0)
MCHC: 31.7 g/dL (ref 30.0–36.0)
MCV: 93.6 fL (ref 78.0–100.0)
Platelets: 120 10*3/uL — ABNORMAL LOW (ref 150–400)
RBC: 2.8 MIL/uL — ABNORMAL LOW (ref 4.22–5.81)
RDW: 15.2 % (ref 11.5–15.5)
WBC: 4.1 10*3/uL (ref 4.0–10.5)

## 2014-11-15 LAB — HEPATITIS B CORE ANTIBODY, TOTAL: Hep B Core Total Ab: NEGATIVE

## 2014-11-15 LAB — IRON AND TIBC
IRON: 51 ug/dL (ref 42–165)
Saturation Ratios: 19 % — ABNORMAL LOW (ref 20–55)
TIBC: 267 ug/dL (ref 215–435)
UIBC: 216 ug/dL (ref 125–400)

## 2014-11-15 LAB — BASIC METABOLIC PANEL
Anion gap: 16 — ABNORMAL HIGH (ref 5–15)
BUN: 42 mg/dL — ABNORMAL HIGH (ref 6–23)
CO2: 24 mmol/L (ref 19–32)
Calcium: 7.9 mg/dL — ABNORMAL LOW (ref 8.4–10.5)
Chloride: 97 mmol/L (ref 96–112)
Creatinine, Ser: 10.4 mg/dL — ABNORMAL HIGH (ref 0.50–1.35)
GFR calc Af Amer: 7 mL/min — ABNORMAL LOW (ref >90)
GFR calc non Af Amer: 6 mL/min — ABNORMAL LOW (ref >90)
GLUCOSE: 109 mg/dL — AB (ref 70–99)
Potassium: 4.7 mmol/L (ref 3.5–5.1)
Sodium: 137 mmol/L (ref 135–145)

## 2014-11-15 LAB — HEPATITIS B SURFACE ANTIBODY,QUALITATIVE: Hep B S Ab: REACTIVE

## 2014-11-15 LAB — MRSA PCR SCREENING: MRSA BY PCR: NEGATIVE

## 2014-11-15 MED ORDER — CARVEDILOL 12.5 MG PO TABS
12.5000 mg | ORAL_TABLET | Freq: Two times a day (BID) | ORAL | Status: DC
  Administered 2014-11-15 – 2014-11-18 (×4): 12.5 mg via ORAL
  Filled 2014-11-15 (×10): qty 1

## 2014-11-15 MED ORDER — HYDRALAZINE HCL 25 MG PO TABS
25.0000 mg | ORAL_TABLET | Freq: Two times a day (BID) | ORAL | Status: DC
  Administered 2014-11-15 – 2014-11-18 (×4): 25 mg via ORAL
  Filled 2014-11-15 (×9): qty 1

## 2014-11-15 MED ORDER — IOHEXOL 300 MG/ML  SOLN
100.0000 mL | Freq: Once | INTRAMUSCULAR | Status: AC | PRN
  Administered 2014-11-15: 1 mL via INTRAVENOUS

## 2014-11-15 MED ORDER — SODIUM CHLORIDE 0.9 % IV SOLN
25.0000 mg | Freq: Once | INTRAVENOUS | Status: AC
  Administered 2014-11-15: 25 mg via INTRAVENOUS
  Filled 2014-11-15 (×2): qty 0.5

## 2014-11-15 MED ORDER — HYDROCODONE-ACETAMINOPHEN 5-325 MG PO TABS
ORAL_TABLET | ORAL | Status: AC
  Filled 2014-11-15: qty 1

## 2014-11-15 MED ORDER — DARBEPOETIN ALFA 60 MCG/0.3ML IJ SOSY
60.0000 ug | PREFILLED_SYRINGE | INTRAMUSCULAR | Status: DC
  Filled 2014-11-15: qty 0.3

## 2014-11-15 MED ORDER — GABAPENTIN 300 MG PO CAPS
300.0000 mg | ORAL_CAPSULE | Freq: Every day | ORAL | Status: DC
Start: 2014-11-15 — End: 2014-11-19
  Administered 2014-11-15 – 2014-11-17 (×3): 300 mg via ORAL
  Filled 2014-11-15 (×5): qty 1

## 2014-11-15 MED ORDER — NICOTINE 21 MG/24HR TD PT24
21.0000 mg | MEDICATED_PATCH | Freq: Every day | TRANSDERMAL | Status: DC
  Filled 2014-11-15 (×5): qty 1

## 2014-11-15 MED ORDER — ISOSORBIDE DINITRATE 20 MG PO TABS
20.0000 mg | ORAL_TABLET | Freq: Two times a day (BID) | ORAL | Status: DC
  Administered 2014-11-15 – 2014-11-16 (×2): 20 mg via ORAL
  Filled 2014-11-15 (×5): qty 1

## 2014-11-15 MED ORDER — SODIUM CHLORIDE 0.9 % IV SOLN
125.0000 mg | INTRAVENOUS | Status: DC
  Filled 2014-11-15 (×4): qty 10

## 2014-11-15 MED ORDER — SODIUM CHLORIDE 0.9 % IV SOLN: 125.0000 mg | Freq: Once | INTRAVENOUS | Status: DC

## 2014-11-15 MED ORDER — DIPHENHYDRAMINE HCL 50 MG/ML IJ SOLN
INTRAMUSCULAR | Status: AC
  Administered 2014-11-15: 25 mg
  Filled 2014-11-15: qty 1

## 2014-11-15 NOTE — Progress Notes (Addendum)
RN paged because pt left floor to smoke after being told he could not. Security was called and pt returned to his room. Nicoderm patch ordered but pt says he may or may not try it. He is agreeing to stay on floor and understands that he may not leave the unit and that this facility is non smoking.  Later, tele d/c'd because pt is continually removing it and will not cooperate.

## 2014-11-15 NOTE — Progress Notes (Signed)
UR completed 

## 2014-11-15 NOTE — Progress Notes (Addendum)
Nutrition Brief Note  Wt Readings from Last 15 Encounters:  11/15/14 157 lb 13.6 oz (71.6 kg)  11/13/14 145 lb 8.1 oz (66 kg)    Alexander [name] is a 29 y.o. male with past mental history of childhood renal disease who is been on dialysis now for some time. Patient was [place] living [place] and moved to [place] in with his brother. He somewhat vague on the details, although it sounds like there were some social issues that led to this. Patient moved here on the first and had not yet had a chance to establish with a dialysis center here.   RD consulted to assess nutritional status. Spoke with RN who reported that pt has been complaining of hunger. Per RN, pt has been receiving double portions and was upset that he was previously NPO for a procedure. RN confirms that pt has not missed a meal since admission.   RN called to expedite dinner order, which nutritional services provided at time of visit. Pt was then transported to a procedure, so this RD did not have the opportunity to speak with pt directly. RN requested RD to provide between meal nourishments to appease pt's hunger and reports pt will not take any renal-friendly nutritional supplements. Will follow-up on 11/16/14 for food preferences.   Body mass index is 28.86 kg/(m^2). Patient meets criteria for overweight based on current BMI.   Current diet order is renal/carb modified with 1200 ml fluid restriction with double portions, patient is consuming approximately 100% of meals at this time. Labs and medications reviewed.   If further nutrition issues arise, please re-consult RD.   Walburga Hudman A. Mayford KnifeWilliams, RD, LDN, CDE Pager: [phone] After hours Pager: [phone]

## 2014-11-15 NOTE — Progress Notes (Signed)
11/15/2014 9:44 AM Hemodialysis Outpatient Note; I have been asked to begin looking for an outpatient facility and schedule for [name]. I will update when I have any information. Thank you.Alexander NeatKrzywonos, Alexander [name]

## 2014-11-15 NOTE — Progress Notes (Addendum)
[facility] Progress Note  Assessment/Plan: 1. Volume overload due to inadequate dialysis  Serial - HD 1.35 2/9 and 2/10 but can't find amount last night that was removed; today treating being cut short because now bleeding around arterial needle with ^ pain; plan resume HD after shuntagram. Noted to have prior left subclavian and innominate stents  UF 1.4 so far today; plan complete HD treatment today and then again on Saturday to keep on schedule and get volume further down. IR didn't want to cannulate the AVF, concerned about possible infection. The arm doesn't look infected to me and he is afebrile.  2. ESRD - on HD since age 29; [facility] - needs to find a center to accept him in the [place] area - seems like he needs to be a 4 hr tmt due to large volumes but that can be addressed if accepted into outpt center.  Plan temporary groin cath today for dialysis 3. Anemia - Hgb 8s - start Aranesp 60, replete  IV Fe tsat 19%  4. Secondary hyperparathyroidism - no iPTH available - will check, supposedly taking renagel and sensipar 5. HTN/volume - excess volume reducing BP meds 6. Nutrition - change diet to renal diet + vit K 5 7. Polysubstance abuse - tells me he moved here to get away from drug scene - living with family members who do not use 8. 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  Have asked [facility] to see patient and they will try to get him on for shuntogram tomorrow.    Sheffield SliderMartha B Bergman, PA-C [facility] Beeper [phone] 11/15/2014,9:30 AM   Pt seen, examined, agree w assess/plan as above with additions as indicated.  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    Objective Filed Vitals:   11/15/14 0108 11/15/14 0443  11/15/14 0900 11/15/14 0905  BP: 126/84 110/58 152/82 129/72  Pulse: 88 97 94 93  Temp: 97.7 F (36.5 C) 98.2 F (36.8 C) 97.6 F (36.4 C)   TempSrc: Oral Oral Oral   Resp: 18 16 20 26   Height: 5\' 2"  (1.575 m)     Weight: 68.3 kg (150 lb 9.2 oz)  72.5 kg (159 lb 13.3 oz)   SpO2: 97% 96% 99%    Physical Exam General: generalized facial edema Heart: RRR Lungs: no rales Abdomen: soft Extremities: + edema Dialysis Access: left upper AVGG VP 260 Qb 300 - massive swelling left arm compared to right  Dialysis Orders: 3.5h F180 400/800 62.5kg 2/2.25 Bath LUA AVG (Accuseal) Heparin 4600  Additional Objective Labs: Basic Metabolic Panel:  Recent Labs Lab 11/13/14 0931 11/13/14 1016 11/13/14 1511 11/13/14 1714 11/14/14 1259  NA 138  --  137  --  139  K >7.5*  --  4.0  --  5.0  CL 98  --  99  --  99  CO2 17*  --   --   --  20  GLUCOSE 93  --  111*  --  90  BUN 131*  --  91*  --  88*  CREATININE 20.97*  --  15.50* 14.48* 16.42*  CALCIUM 9.2  --   --   --  7.9*  PHOS  --  9.1*  --  6.5*  --    Liver Function Tests:  Recent Labs Lab 11/13/14 1016  AST 25  ALT 19  ALKPHOS 1120*  BILITOT 0.8  PROT 7.3  ALBUMIN 3.6   CBC:  Recent Labs Lab 11/13/14 0931  11/13/14 1714 11/14/14 1259 11/15/14 0500  WBC 5.1  --  4.2 3.5* 4.1  HGB 9.4*  < > 8.3* 8.5* 8.3*  HCT 29.8*  < > 25.3* 26.2* 26.2*  MCV 97.1  --  92.3 93.9 93.6  PLT 131*  --  122* 121* 120*  < > = values in this interval not displayed. B Iron Studies:  Recent Labs  11/14/14 1622  IRON 51  TIBC 267   @ Studies/Results: Dg Chest 2 View  11/14/2014   CLINICAL DATA:  Left-sided chest pain for 3 days, history of smoking  EXAM: CHEST  2 VIEW  COMPARISON:  11/13/2014  FINDINGS: Cardiac shadow remains enlarged. Left subclavian and innominate vein stenting is again identified. A persistent right basilar infiltrate is seen. No new focal infiltrates are noted. No acute bony abnormality is  seen.  IMPRESSION: Persistent right basilar infiltrate.   Electronically Signed   By: Alcide Clever M.D.   On: 11/14/2014 14:44   Dg Chest Port 1 View  11/13/2014   CLINICAL DATA:  Chest pain and cough.  Dyspnea.  EXAM: PORTABLE CHEST - 1 VIEW  COMPARISON:  None.  FINDINGS: There is cardiomegaly. There is slight pulmonary vascular congestion. Stents in the left innominate vein and left subclavian vein.  There is slight ground-glass haziness in the right lung. No effusions. No acute osseous abnormalities.  IMPRESSION: 1. Cardiomegaly with slight pulmonary vascular congestion. 2. Slight diffuse haziness in the right lung which could represent pneumonitis or pulmonary edema.   Electronically Signed   By: Francene Boyers M.D.   On: 11/13/2014 10:30   Medications:   . carvedilol  25 mg Oral BID WC  . cinacalcet  60 mg Oral Q supper  . enoxaparin (LOVENOX) injection  30 mg Subcutaneous Q24H  . famotidine  20 mg Oral Daily  . gabapentin  300 mg Oral QHS  . hydrALAZINE  50 mg Oral BID  . HYDROcodone-acetaminophen      . isosorbide dinitrate  40 mg Oral BID  . metoCLOPramide  5 mg Oral TID AC & HS  . NIFEdipine  60 mg Oral Daily  . sevelamer carbonate  800 mg Oral TID WC  . sodium chloride  3 mL Intravenous Q12H

## 2014-11-15 NOTE — Care Management Note (Signed)
CARE MANAGEMENT NOTE 11/15/2014  Patient:  Alexander [name],Alexander [name]   Account Number:  [id number]  Date Initiated:  11/15/2014  Documentation initiated by:  Johny ShockOYAL,Taria Castrillo  Subjective/Objective Assessment:   CM following for progression and d/c planning.     Action/Plan:   HD unit secretary , Darel HongJudy working with this pt on outpatient HD center placement. Unfortunately this pt has demonstrated total noncompliance since his [place] time in [place].   Anticipated DC Date:  11/17/2014   Anticipated DC Plan:  HOME/SELF CARE         Choice offered to / List presented to:             Status of service:  In process, will continue to follow Medicare Important Message given?   (If response is "NO", the following Medicare IM given date fields will be blank) Date Medicare IM given:   Medicare IM given by:   Date Additional Medicare IM given:   Additional Medicare IM given by:    Discharge Disposition:    Per UR Regulation:    If discussed at Long Length of Stay Meetings, dates discussed:    Comments:

## 2014-11-15 NOTE — Progress Notes (Signed)
Pt HD ended after one hour. Access arm swollen, pain and bleeding profusely at needle cannulation sites. Pt. States it has been like this for 3 days. Rinsed back w/ no complications. PA aware and shuntogram scheduled for today. Pt alert, vss.

## 2014-11-15 NOTE — Procedures (Signed)
I was present at this dialysis session, have reviewed the session itself and made  appropriate changes  Vinson Moselleob Traves Majchrzak MD (pgr) [phone]    [phone] 11/15/2014, 3:52 PM

## 2014-11-15 NOTE — Progress Notes (Signed)
[facility] PROGRESS NOTE  Alexander [name] RUE:[id number] DOB: [birth year] DOA: 11/14/2014 PCP: No PCP Per Patient  29 year old African-American male with end-stage renal on HD [facility] who presented to the ED fluid overloaded from noncompliance with dialysis as an outpatient.  Assessment/Plan: 1. ESRD on HD - Nephrology on board managing  2. Anemia - Most likely from chronic renal disease - Monitoring, no active bleeding  3. Hyperglycemia - We'll obtain hemoglobin A1c -Currently on carb modified diet, will continue to monitor CBGs  Code Status: Full Family Communication: No family at bedside  Disposition Plan: Pending further recommendations from nephrology   Consultants:  Nephrology  Procedures:  Dialysis  Antibiotics:  None  HPI/Subjective: Pt has no new complaints. No acute issues reported overnight. Reportedly patient has history of noncompliance with dialysis sessions. Currently trying to find outpatient dialysis that will accept him  Objective: Filed Vitals:   11/15/14 1005  BP: 107/52  Pulse: 53  Temp: 97.5 F (36.4 C)  Resp: 23    Intake/Output Summary (Last 24 hours) at 11/15/14 1809 Last data filed at 11/15/14 1648  Gross per 24 hour  Intake    720 ml  Output    910 ml  Net   -190 ml   Filed Weights   11/15/14 0108 11/15/14 0900 11/15/14 1005  Weight: 68.3 kg (150 lb 9.2 oz) 72.5 kg (159 lb 13.3 oz) 71.6 kg (157 lb 13.6 oz)    Exam:   General:  Patient in no acute distress alert and awake  Cardiovascular: Regular rate and rhythm, no murmurs or rubs  Respiratory: Clear to auscultation bilaterally, no wheezes  Abdomen: Soft, nondistended  Musculoskeletal: Normal muscle tone  Data Reviewed: Basic Metabolic Panel:  Recent Labs Lab 11/13/14 0931 11/13/14 1016 11/13/14 1511 11/13/14 1714 11/14/14 1259 11/15/14 0500  NA 138  --  137  --  139 137  K >7.5*  --  4.0  --  5.0 4.7  CL 98  --  99  --  99 97  CO2 17*  --    --   --  20 24  GLUCOSE 93  --  111*  --  90 109*  BUN 131*  --  91*  --  88* 42*  CREATININE 20.97*  --  15.50* 14.48* 16.42* 10.40*  CALCIUM 9.2  --   --   --  7.9* 7.9*  MG  --  2.3  --  2.0  --   --   PHOS  --  9.1*  --  6.5*  --   --    Liver Function Tests:  Recent Labs Lab 11/13/14 1016  AST 25  ALT 19  ALKPHOS 1120*  BILITOT 0.8  PROT 7.3  ALBUMIN 3.6   No results for input(s): LIPASE, AMYLASE in the last 168 hours. No results for input(s): AMMONIA in the last 168 hours. CBC:  Recent Labs Lab 11/13/14 0931 11/13/14 1511 11/13/14 1714 11/14/14 1259 11/15/14 0500  WBC 5.1  --  4.2 3.5* 4.1  HGB 9.4* 10.2* 8.3* 8.5* 8.3*  HCT 29.8* 30.0* 25.3* 26.2* 26.2*  MCV 97.1  --  92.3 93.9 93.6  PLT 131*  --  122* 121* 120*   Cardiac Enzymes: No results for input(s): CKTOTAL, CKMB, CKMBINDEX, TROPONINI in the last 168 hours. BNP (last 3 results)  Recent Labs  11/13/14 0931  BNP 3040.1*    ProBNP (last 3 results) No results for input(s): PROBNP in the last 8760 hours.  CBG: No  results for input(s): GLUCAP in the last 168 hours.  Recent Results (from the past 240 hour(s))  MRSA PCR Screening     Status: None   Collection Time: 11/15/14  2:15 AM  Result Value Ref Range Status   MRSA by PCR NEGATIVE NEGATIVE Final    Comment:        The GeneXpert MRSA Assay (FDA approved for NASAL specimens only), is one component of a comprehensive MRSA colonization surveillance program. It is not intended to diagnose MRSA infection nor to guide or monitor treatment for MRSA infections.      Studies: Dg Chest 2 View  11/14/2014   CLINICAL DATA:  Left-sided chest pain for 3 days, history of smoking  EXAM: CHEST  2 VIEW  COMPARISON:  11/13/2014  FINDINGS: Cardiac shadow remains enlarged. Left subclavian and innominate vein stenting is again identified. A persistent right basilar infiltrate is seen. No new focal infiltrates are noted. No acute bony abnormality is seen.   IMPRESSION: Persistent right basilar infiltrate.   Electronically Signed   By: Alcide CleverMark  Lukens M.D.   On: 11/14/2014 14:44    Scheduled Meds: . carvedilol  12.5 mg Oral BID WC  . cinacalcet  60 mg Oral Q supper  . darbepoetin (ARANESP) injection - DIALYSIS  60 mcg Intravenous Q Thu-HD  . enoxaparin (LOVENOX) injection  30 mg Subcutaneous Q24H  . famotidine  20 mg Oral Daily  . ferric gluconate (FERRLECIT/NULECIT) IV  125 mg Intravenous Q T,Th,Sa-HD  . gabapentin  300 mg Oral QHS  . hydrALAZINE  25 mg Oral BID  . HYDROcodone-acetaminophen      . isosorbide dinitrate  20 mg Oral BID  . metoCLOPramide  5 mg Oral TID AC & HS  . sevelamer carbonate  800 mg Oral TID WC  . sodium chloride  3 mL Intravenous Q12H   Continuous Infusions:   Principal Problem:   Volume overload Active Problems:   ESRD (end stage renal disease) on dialysis   Hypertension   Tobacco abuse   Chronic pain of left upper extremity    Time spent: > 35     [name], [name]  [facility] Pager [phone]. If 7PM-7AM, please contact night-coverage at [email or web address], password [email or web address] 11/15/2014, 6:09 PM  LOS: 0 days

## 2014-11-15 NOTE — Progress Notes (Signed)
Pt is requesting to go off the floor. Pt educated he couldn't due to his telemetry, insists on going anyway. Pt left the floor with girlfriend. Page to Donnamarie PoagK. Kirby and security for notification. Patient found outside smoking and returned to his room. Educated he may not leave the floor again. If he does he must sign out AMA. New order for nicotine patch. Patient states he understands. Dondra SpryMoore, Kamaury Cutbirth Islee, RN

## 2014-11-16 ENCOUNTER — Encounter (HOSPITAL_COMMUNITY): Payer: Self-pay | Admitting: Radiology

## 2014-11-16 ENCOUNTER — Inpatient Hospital Stay (HOSPITAL_COMMUNITY): Payer: Medicaid Other

## 2014-11-16 ENCOUNTER — Encounter (HOSPITAL_COMMUNITY)
Admission: EM | Disposition: A | Discharge status: Home / Self Care | Payer: Self-pay | Source: Home / Self Care | Attending: Internal Medicine

## 2014-11-16 DIAGNOSIS — N186 End stage renal disease: Secondary | ICD-10-CM

## 2014-11-16 DIAGNOSIS — N189 Chronic kidney disease, unspecified: Secondary | ICD-10-CM

## 2014-11-16 DIAGNOSIS — Q261 Persistent left superior vena cava: Secondary | ICD-10-CM

## 2014-11-16 DIAGNOSIS — E875 Hyperkalemia: Secondary | ICD-10-CM

## 2014-11-16 LAB — CBC
HCT: 26.2 % — ABNORMAL LOW (ref 39.0–52.0)
Hemoglobin: 8.4 g/dL — ABNORMAL LOW (ref 13.0–17.0)
MCH: 30.3 pg (ref 26.0–34.0)
MCHC: 32.1 g/dL (ref 30.0–36.0)
MCV: 94.6 fL (ref 78.0–100.0)
PLATELETS: 113 10*3/uL — AB (ref 150–400)
RBC: 2.77 MIL/uL — AB (ref 4.22–5.81)
RDW: 15.2 % (ref 11.5–15.5)
WBC: 4.9 10*3/uL (ref 4.0–10.5)

## 2014-11-16 LAB — RENAL FUNCTION PANEL
ALBUMIN: 3.1 g/dL — AB (ref 3.5–5.2)
Anion gap: 19 — ABNORMAL HIGH (ref 5–15)
BUN: 53 mg/dL — ABNORMAL HIGH (ref 6–23)
CALCIUM: 8.4 mg/dL (ref 8.4–10.5)
CO2: 17 mmol/L — ABNORMAL LOW (ref 19–32)
Chloride: 99 mmol/L (ref 96–112)
Creatinine, Ser: 11.68 mg/dL — ABNORMAL HIGH (ref 0.50–1.35)
GFR calc non Af Amer: 5 mL/min — ABNORMAL LOW (ref >90)
GFR, EST AFRICAN AMERICAN: 6 mL/min — AB (ref >90)
GLUCOSE: 82 mg/dL (ref 70–99)
POTASSIUM: 5.7 mmol/L — AB (ref 3.5–5.1)
Phosphorus: 7.3 mg/dL — ABNORMAL HIGH (ref 2.3–4.6)
SODIUM: 135 mmol/L (ref 135–145)

## 2014-11-16 LAB — APTT: aPTT: 40 seconds — ABNORMAL HIGH (ref 24–37)

## 2014-11-16 LAB — PROTIME-INR
INR: 1.17 (ref 0.00–1.49)
Prothrombin Time: 15.1 seconds (ref 11.6–15.2)

## 2014-11-16 SURGERY — ASSESSMENT, SHUNT FUNCTION, WITH CONTRAST RADIOGRAPHIC STUDY
Anesthesia: LOCAL | Laterality: Left

## 2014-11-16 MED ORDER — IOHEXOL 300 MG/ML  SOLN
100.0000 mL | Freq: Once | INTRAMUSCULAR | Status: AC | PRN
  Administered 2014-11-16: 50 mL via INTRAVENOUS

## 2014-11-16 MED ORDER — DARBEPOETIN ALFA 60 MCG/0.3ML IJ SOSY
PREFILLED_SYRINGE | INTRAMUSCULAR | Status: AC
  Filled 2014-11-16: qty 0.3

## 2014-11-16 MED ORDER — DARBEPOETIN ALFA 60 MCG/0.3ML IJ SOSY
60.0000 ug | PREFILLED_SYRINGE | Freq: Once | INTRAMUSCULAR | Status: AC
  Administered 2014-11-16: 60 ug via INTRAVENOUS
  Filled 2014-11-16: qty 0.3

## 2014-11-16 MED ORDER — FENTANYL CITRATE 0.05 MG/ML IJ SOLN
INTRAMUSCULAR | Status: AC
  Filled 2014-11-16: qty 2

## 2014-11-16 MED ORDER — FENTANYL CITRATE 0.05 MG/ML IJ SOLN
50.0000 ug | Freq: Once | INTRAMUSCULAR | Status: AC
  Administered 2014-11-16: 50 ug via INTRAVENOUS

## 2014-11-16 MED ORDER — DIPHENHYDRAMINE HCL 50 MG/ML IJ SOLN
25.0000 mg | Freq: Once | INTRAMUSCULAR | Status: AC
  Administered 2014-11-16: 25 mg via INTRAVENOUS

## 2014-11-16 MED ORDER — IOHEXOL 300 MG/ML  SOLN: 100.0000 mL | Freq: Once | INTRAMUSCULAR | Status: AC | PRN

## 2014-11-16 MED ORDER — DIPHENHYDRAMINE HCL 50 MG/ML IJ SOLN
INTRAMUSCULAR | Status: AC
  Filled 2014-11-16: qty 1

## 2014-11-16 MED ORDER — LIDOCAINE HCL 1 % IJ SOLN
INTRAMUSCULAR | Status: AC
  Filled 2014-11-16: qty 20

## 2014-11-16 MED ORDER — DIPHENHYDRAMINE HCL 50 MG/ML IJ SOLN
25.0000 mg | Freq: Once | INTRAMUSCULAR | Status: AC
  Administered 2014-11-16: 25 mg via INTRAVENOUS
  Filled 2014-11-16: qty 1

## 2014-11-16 NOTE — Procedures (Signed)
Shuntogram 14mm PTA L subclav and Innom vein restenoses No complication No blood loss. [facility].

## 2014-11-16 NOTE — Progress Notes (Addendum)
[facility] PROGRESS NOTE  Gerad Cornelio ZOX:[id number] DOB: [birth year] DOA: 11/14/2014 PCP: No PCP Per Patient  29 year old African-American male with end-stage renal on HD [facility] who presented to the ED fluid overloaded from noncompliance with dialysis as an outpatient.  Assessment/Plan: 1. ESRD on HD - Nephrology on board managing NPO due to HD cath placement today.  AVF w excess bleeding and pain during HD prob d/t increased pressures from central restenosis Has stents in L SCV and L innominate vein per CXR. Interventional radiology to assess for attempt to salvage L arm access and if unable will need tunneled HD cath. Has had tunneled catheters before, including bilat chest and R femoral. Needs to be clipped  2. Anemia - Most likely from chronic renal disease - Monitoring, no active bleeding Hgb 8s - start Aranesp    3. Hyperglycemia Pending hemoglobin A1c Resume carb modified diet after hemodialysis catheter placement, will continue to monitor CBGs  History of polysubstance abuse Check a UDS , if the patient is making any urine, otherwise serum drug screen  Code Status: Full Family Communication: No family at bedside  Disposition Plan: Pending further recommendations from nephrology   Consultants:  Nephrology  Procedures:  Dialysis  Antibiotics:  None  HPI/Subjective: Patient ambulating the halls, still requesting physical therapy, no new complaints  Objective: Filed Vitals:   11/16/14 1100  BP: 165/104  Pulse: 97  Temp: 98 F (36.7 C)  Resp:     Intake/Output Summary (Last 24 hours) at 11/16/14 1151 Last data filed at 11/16/14 1018  Gross per 24 hour  Intake   1080 ml  Output      0 ml  Net   1080 ml   Filed Weights   11/15/14 0108 11/15/14 0900 11/15/14 1005  Weight: 68.3 kg (150 lb 9.2 oz) 72.5 kg (159 lb 13.3 oz) 71.6 kg (157 lb 13.6 oz)    Exam:   General:  Patient in no acute distress alert and  awake  Cardiovascular: Regular rate and rhythm, no murmurs or rubs  Respiratory: Clear to auscultation bilaterally, no wheezes  Abdomen: Soft, nondistended  Musculoskeletal: Normal muscle tone  Data Reviewed: Basic Metabolic Panel:  Recent Labs Lab 11/13/14 0931 11/13/14 1016 11/13/14 1511 11/13/14 1714 11/14/14 1259 11/15/14 0500  NA 138  --  137  --  139 137  K >7.5*  --  4.0  --  5.0 4.7  CL 98  --  99  --  99 97  CO2 17*  --   --   --  20 24  GLUCOSE 93  --  111*  --  90 109*  BUN 131*  --  91*  --  88* 42*  CREATININE 20.97*  --  15.50* 14.48* 16.42* 10.40*  CALCIUM 9.2  --   --   --  7.9* 7.9*  MG  --  2.3  --  2.0  --   --   PHOS  --  9.1*  --  6.5*  --   --    Liver Function Tests:  Recent Labs Lab 11/13/14 1016  AST 25  ALT 19  ALKPHOS 1120*  BILITOT 0.8  PROT 7.3  ALBUMIN 3.6   No results for input(s): LIPASE, AMYLASE in the last 168 hours. No results for input(s): AMMONIA in the last 168 hours. CBC:  Recent Labs Lab 11/13/14 0931 11/13/14 1511 11/13/14 1714 11/14/14 1259 11/15/14 0500  WBC 5.1  --  4.2 3.5* 4.1  HGB 9.4*  10.2* 8.3* 8.5* 8.3*  HCT 29.8* 30.0* 25.3* 26.2* 26.2*  MCV 97.1  --  92.3 93.9 93.6  PLT 131*  --  122* 121* 120*   Cardiac Enzymes: No results for input(s): CKTOTAL, CKMB, CKMBINDEX, TROPONINI in the last 168 hours. BNP (last 3 results)  Recent Labs  11/13/14 0931  BNP 3040.1*    ProBNP (last 3 results) No results for input(s): PROBNP in the last 8760 hours.  CBG: No results for input(s): GLUCAP in the last 168 hours.  Recent Results (from the past 240 hour(s))  MRSA PCR Screening     Status: None   Collection Time: 11/15/14  2:15 AM  Result Value Ref Range Status   MRSA by PCR NEGATIVE NEGATIVE Final    Comment:        The GeneXpert MRSA Assay (FDA approved for NASAL specimens only), is one component of a comprehensive MRSA colonization surveillance program. It is not intended to diagnose  MRSA infection nor to guide or monitor treatment for MRSA infections.      Studies: Dg Chest 2 View  11/14/2014   CLINICAL DATA:  Left-sided chest pain for 3 days, history of smoking  EXAM: CHEST  2 VIEW  COMPARISON:  11/13/2014  FINDINGS: Cardiac shadow remains enlarged. Left subclavian and innominate vein stenting is again identified. A persistent right basilar infiltrate is seen. No new focal infiltrates are noted. No acute bony abnormality is seen.  IMPRESSION: Persistent right basilar infiltrate.   Electronically Signed   By: Alcide CleverMark  Lukens M.D.   On: 11/14/2014 14:44    Scheduled Meds: . carvedilol  12.5 mg Oral BID WC  . cinacalcet  60 mg Oral Q supper  . darbepoetin (ARANESP) injection - DIALYSIS  60 mcg Intravenous Q Thu-HD  . darbepoetin (ARANESP) injection - DIALYSIS  60 mcg Intravenous Once  . enoxaparin (LOVENOX) injection  30 mg Subcutaneous Q24H  . famotidine  20 mg Oral Daily  . ferric gluconate (FERRLECIT/NULECIT) IV  125 mg Intravenous Q T,Th,Sa-HD  . gabapentin  300 mg Oral QHS  . hydrALAZINE  25 mg Oral BID  . isosorbide dinitrate  20 mg Oral BID  . metoCLOPramide  5 mg Oral TID AC & HS  . nicotine  21 mg Transdermal Daily  . sevelamer carbonate  800 mg Oral TID WC  . sodium chloride  3 mL Intravenous Q12H   Continuous Infusions:   Principal Problem:   Volume overload Active Problems:   ESRD (end stage renal disease) on dialysis   Hypertension   Tobacco abuse   Chronic pain of left upper extremity    Time spent: > 35     [facility],Karalyn Kadel  [facility] Pager [phone]. If 7PM-7AM, please contact night-coverage at [email or web address], password [email or web address] 11/16/2014, 11:51 AM  LOS: 1 day

## 2014-11-16 NOTE — Progress Notes (Signed)
Pt has removed his telemetry 3x. He has been educated to leave it on and of it's purpose, but continues to remove it. Page to Donnamarie PoagK Kirby for notification. New order to discontinue at this time. Will continue to monitor. Dondra SpryMoore, Harshith Pursell Islee, RN

## 2014-11-16 NOTE — Consult Note (Signed)
Consult Note  Patient name: Alexander RiggRichard [name] MRN: [id number] DOB: 09/14/1986 Sex: male  Consulting Physician:  Renal  Reason for Consult:  Chief Complaint  Patient presents with  . Shortness of Breath    HISTORY OF PRESENT ILLNESS: This is a 29 year old gentleman with a long-standing history of end-stage renal disease, on dialysis.  [place] has [place] been [place] [facility] [place] and recently moved to [place].  He was admitted on 212 with a swollen left arm and feeling short of breath.  He reports having 2 grafts in his left upper arm.  His arm is very painful.  Past Medical History  Diagnosis Date  . Hypertension   . ESRD on hemodialysis 11/13/2014    Per patient had childhood kidney disease, not sure etiology.  Around age 29 was started on RRT with PD for a short time, then changed to HD which he says he has been doing for about 16 years.  Has a LUA AVF as of Feb 2016.       History reviewed. No pertinent past surgical history.  History   Social History  . Marital Status: Single    Spouse Name: N/A  . Number of Children: N/A  . Years of Education: N/A   Occupational History  . Not on file.   Social History Main Topics  . Smoking status: Current Every Day Smoker -- 1.00 packs/day for 5 years    Types: Cigarettes  . Smokeless tobacco: Not on file  . Alcohol Use: No  . Drug Use: No  . Sexual Activity: Not on file   Other Topics Concern  . Not on file   Social History Narrative    History reviewed. No pertinent family history.  Allergies as of 11/14/2014  . (No Known Allergies)    No current facility-administered medications on file prior to encounter.   Current Outpatient Prescriptions on File Prior to Encounter  Medication Sig Dispense Refill  . carvedilol (COREG) 25 MG tablet Take 25 mg by mouth 2 (two) times daily with a meal.    . cinacalcet (SENSIPAR) 60 MG tablet Take 60 mg by mouth daily.    . famotidine (PEPCID) 20 MG tablet Take 20  mg by mouth daily.     . hydrALAZINE (APRESOLINE) 50 MG tablet Take 50 mg by mouth 2 (two) times daily.    . isosorbide dinitrate (ISORDIL) 40 MG tablet Take 40 mg by mouth 2 (two) times daily.    . metoCLOPramide (REGLAN) 5 MG tablet Take 5 mg by mouth 4 (four) times daily -  before meals and at bedtime.    Marland Kitchen. NIFEdipine (PROCARDIA XL/ADALAT-CC) 60 MG 24 hr tablet Take 60 mg by mouth daily.    . sevelamer (RENAGEL) 800 MG tablet Take 800 mg by mouth 3 (three) times daily with meals.       REVIEW OF SYSTEMS: Cardiovascular: No chest pain, chest pressure, palpitations.  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 Musculoskeletal: No joint pain or joint swelling. Gastrointestinal: No blood in stool or hematemesis Genitourinary: No dysuria or hematuria. Psychiatric:: No history of major depression. Integumentary: No rashes or ulcers. Constitutional: No fever or chills.  PHYSICAL EXAMINATION: General: The patient appears their stated age.  Vital signs are BP 138/94 mmHg  Pulse 94  Temp(Src) 97.5 F (36.4 C) (Oral)  Resp 16  Ht 5\' 2"  (1.575  m)  Wt 157 lb 13.6 oz (71.6 kg)  BMI 28.86 kg/m2  SpO2 99% Pulmonary: Respirations are non-labored HEENT:  No gross abnormalities Abdomen: Soft and non-tender  Musculoskeletal: There are no major deformities.   Neurologic: No focal weakness or paresthesias are detected, Skin: There are no ulcer or rashes noted. Psychiatric: The patient has normal affect. Cardiovascular: Very edematous and tender left upper arm with obvious edema in the head and neck and left upper arm area.   Diagnostic Studies: None   Assessment:  Superior vena cava syndrome Plan: I discussed that the next step is to proceed with shuntogram to evaluate for any correctable causes of the patient's edema.  There is a reported that he may have had stents  placed in his upper arm but the patient denies this.  I discussed potentially requiring ligation of his graft to help with his swelling.  His shuntogram will be performed later today.     Jorge Ny, M.D. [facility] Office: [phone] Pager:  [phone]

## 2014-11-16 NOTE — Consult Note (Signed)
Chief Complaint: Chief Complaint  Patient presents with  . Shortness of Breath  left arm and face swelling  Referring Physician(s): Renal service; [facility]  History of Present Illness: Alexander [name] is a 29 y.o. male  ESRD L upper arm dialysis graft: last use 11/15/14  Very painful, swollen left upper arm Facial swelling obvious Has hx L subclavian and L innominate Venous stents per Nephrology notes Has been seen by vascular surgery Request for L upper arm dialysis graft shuntogram with possible angioplasty/stent placement Possible tunneled dialysis catheter placement if needed Dr Deanne Coffer has reviewed imaging and approves procedure I have seen and examined pt  Past Medical History  Diagnosis Date  . Hypertension   . ESRD on hemodialysis 11/13/2014    Per patient had childhood kidney disease, not sure etiology.  Around age 83 was started on RRT with PD for a short time, then changed to HD which he says he has been doing for about 16 years.  Has a LUA AVF as of Feb 2016.       History reviewed. No pertinent past surgical history.  Allergies: Review of patient's allergies indicates no known allergies.  Medications: Prior to Admission medications   Medication Sig Start Date End Date Taking? Authorizing Provider  carvedilol (COREG) 25 MG tablet Take 25 mg by mouth 2 (two) times daily with a meal.   Yes Historical Provider, MD  cinacalcet (SENSIPAR) 60 MG tablet Take 60 mg by mouth daily.   Yes Historical Provider, MD  famotidine (PEPCID) 20 MG tablet Take 20 mg by mouth daily.    Yes Historical Provider, MD  hydrALAZINE (APRESOLINE) 50 MG tablet Take 50 mg by mouth 2 (two) times daily.   Yes Historical Provider, MD  isosorbide dinitrate (ISORDIL) 40 MG tablet Take 40 mg by mouth 2 (two) times daily.   Yes Historical Provider, MD  metoCLOPramide (REGLAN) 5 MG tablet Take 5 mg by mouth 4 (four) times daily -  before meals and at bedtime.   Yes Historical Provider, MD  NIFEdipine  (PROCARDIA XL/ADALAT-CC) 60 MG 24 hr tablet Take 60 mg by mouth daily.   Yes Historical Provider, MD  sevelamer (RENAGEL) 800 MG tablet Take 800 mg by mouth 3 (three) times daily with meals.   Yes Historical Provider, MD     History reviewed. No pertinent family history.  History   Social History  . Marital Status: Single    Spouse Name: N/A  . Number of Children: N/A  . Years of Education: N/A   Social History Main Topics  . Smoking status: Current Every Day Smoker -- 1.00 packs/day for 5 years    Types: Cigarettes  . Smokeless tobacco: Not on file  . Alcohol Use: No  . Drug Use: No  . Sexual Activity: Not on file   Other Topics Concern  . None   Social History Narrative      Review of Systems: A 12 point ROS discussed and pertinent positives are indicated in the HPI above.  All other systems are negative.  Review of Systems  Constitutional: Positive for activity change and fatigue. Negative for fever.  HENT: Positive for facial swelling, trouble swallowing and voice change.   Respiratory: Positive for shortness of breath. Negative for chest tightness.   Gastrointestinal: Negative for abdominal pain.  Musculoskeletal: Positive for neck pain and neck stiffness. Negative for back pain.  Psychiatric/Behavioral: Negative for behavioral problems and confusion.    Vital Signs: BP 138/94 mmHg  Pulse 94  Temp(Src) 97.5 F (36.4 C) (Oral)  Resp 16  Ht  (1.575 m)  Wt 71.6 kg (157 lb 13.6 oz)  BMI 28.86 kg/m2  SpO2 99%  Physical Exam  Constitutional: He is oriented to person, place, and time. He appears well-nourished.  Miserable; crying  HENT:  Facial swelling is obvious  Neck:  Swelling obvious  Cardiovascular: Normal rate, regular rhythm and normal heart sounds.   No murmur heard. Pulmonary/Chest: Effort normal. He has wheezes.  Abdominal: Soft. Bowel sounds are normal.  Musculoskeletal: Normal range of motion.  Left arm with noted difference in upper and  lower arm Swelling extreme in upper arm Very pianful  Neurological: He is alert and oriented to person, place, and time.  Skin: Skin is warm and dry.  Psychiatric: He has a normal mood and affect. His behavior is normal. Judgment and thought content normal.  Nursing note and vitals reviewed.   Mallampati Score:  MD Evaluation Airway: WNL Heart: WNL Abdomen: WNL Chest/ Lungs: WNL ASA  Classification: 3 Mallampati/Airway Score: Two  Imaging: Dg Chest 2 View  11/14/2014   CLINICAL DATA:  Left-sided chest pain for 3 days, history of smoking  EXAM: CHEST  2 VIEW  COMPARISON:  11/13/2014  FINDINGS: Cardiac shadow remains enlarged. Left subclavian and innominate vein stenting is again identified. A persistent right basilar infiltrate is seen. No new focal infiltrates are noted. No acute bony abnormality is seen.  IMPRESSION: Persistent right basilar infiltrate.   Electronically Signed   By: Alcide Clever M.D.   On: 11/14/2014 14:44   Dg Chest Port 1 View  11/13/2014   CLINICAL DATA:  Chest pain and cough.  Dyspnea.  EXAM: PORTABLE CHEST - 1 VIEW  COMPARISON:  None.  FINDINGS: There is cardiomegaly. There is slight pulmonary vascular congestion. Stents in the left innominate vein and left subclavian vein.  There is slight ground-glass haziness in the right lung. No effusions. No acute osseous abnormalities.  IMPRESSION: 1. Cardiomegaly with slight pulmonary vascular congestion. 2. Slight diffuse haziness in the right lung which could represent pneumonitis or pulmonary edema.   Electronically Signed   By: Francene Boyers M.D.   On: 11/13/2014 10:30    Labs:  CBC:  Recent Labs  11/13/14 0931 11/13/14 1511 11/13/14 1714 11/14/14 1259 11/15/14 0500  WBC 5.1  --  4.2 3.5* 4.1  HGB 9.4* 10.2* 8.3* 8.5* 8.3*  HCT 29.8* 30.0* 25.3* 26.2* 26.2*  PLT 131*  --  122* 121* 120*    COAGS: No results for input(s): INR, APTT in the last 8760 hours.  BMP:  Recent Labs  11/13/14 0931  11/13/14 1511 11/13/14 1714 11/14/14 1259 11/15/14 0500  NA 138 137  --  139 137  K >7.5* 4.0  --  5.0 4.7  CL 98 99  --  99 97  CO2 17*  --   --  20 24  GLUCOSE 93 111*  --  90 109*  BUN 131* 91*  --  88* 42*  CALCIUM 9.2  --   --  7.9* 7.9*  CREATININE 20.97* 15.50* 14.48* 16.42* 10.40*  GFRNONAA 2*  --  4* 3* 6*  GFRAA 3*  --  5* 4* 7*    LIVER FUNCTION TESTS:  Recent Labs  11/13/14 1016  BILITOT 0.8  AST 25  ALT 19  ALKPHOS 1120*  PROT 7.3  ALBUMIN 3.6    TUMOR MARKERS: No results for input(s): AFPTM, CEA, CA199, CHROMGRNA in the last 8760 hours.  Assessment and Plan:  ESRD Left upper arm dialysis graft with painful swelling Last use 11/15/14---did not complete Facial swelling obvious Scheduled for L upper arm shuntogram with poss pta/stent placement Possible tunneled HD catheter if needed Pt aware of procedure benefits and risks including but not limited to: Infection; bleeding; vessel damage; PE Agreeable to proceed Consent signed andi chart  Thank you for this interesting consult.  I greatly enjoyed meeting Claiborne RiggRichard Beckmann and look forward to participating in their care.  Signed: Alliya Marcon A 11/16/2014, 11:04 AM   I spent a total of 30 Minutes  in face to face in clinical consultation, greater than 50% of which was counseling/coordinating care for L arm dialysis graft shuntogram or HD catheter

## 2014-11-16 NOTE — Progress Notes (Signed)
Brief Nutrition Follow-Up Note  Chart reviewed. Pt is currently NPO due to HD cath placement today.  Between meal nourishments were ordered on 11/15/14 due to pt's complaints of excess hunger. He was previously on a renal/carb modified diet with 1200 ml fluid restriction with orders for double portions.  If further nutrition issues arise, please re-consult RD.   Jonee Lamore A. Mayford KnifeWilliams, RD, LDN, CDE Pager: [phone] After hours Pager: [phone]

## 2014-11-16 NOTE — Progress Notes (Signed)
[facility] Progress Note  Assessment: 1. HD access malfunction / facial / LUE edema - going on for about a month, AVF w excess bleeding and pain during HD prob d/t increased pressures from central restenosis.  AVF in for about a year. Has stents in L SCV and L innominate vein per CXR.  Have asked IR to assess for attempt to salvage L arm access and if unable will need tunneled HD cath. Has had tunneled catheters before, including bilat chest and R femoral.   2. ESRD - on HD since age 29; previous TTS [facility] - needs to find a center to accept him in the [place] area - seems like he needs to be a 4 hr tmt due to large volumes but that can be addressed if accepted into outpt center. 3. Anemia - Hgb 8s - start Aranesp 60, replete  IV Fe tsat 19%  4. Secondary hyperparathyroidism - no iPTH available - will check, supposedly taking renagel and sensipar 5. HTN/volume - up 8kg by wt, no resp symptoms. Or coreg/ hydralazine/ Imdur 6. Nutrition - change diet to renal diet + vit K 5 7. Polysubstance abuse - tells me he moved here to get away from drug scene - living with family members who do not use  Plan - to IR today for access issues, will plan HD later today or tomorrow after access established.   Vinson Moselle MD pager [phone]    cell [phone] 11/16/2014, 11:25 AM   Subjective:   No new complaints  Objective Filed Vitals:   11/15/14 1833 11/15/14 2043 11/16/14 0426 11/16/14 0915  BP: 101/59 142/74 163/90 138/94  Pulse: 87 95 100 94  Temp: 98.4 F (36.9 C) 98 F (36.7 C) 97.6 F (36.4 C) 97.5 F (36.4 C)  TempSrc: Oral Oral Oral Oral  Resp: Height:      Weight:      SpO2: 98% 100% 100% 99%   Physical Exam General: generalized facial edema Heart: RRR Lungs: no rales Abdomen: soft Extremities: + 3 LUE edema, 1+ bilat LE edema Dialysis Access: left upper AVGG marked edema Neuro - is alert, Ox 3, NF  Dialysis Orders: 3.5h F180 400/800 62.5kg  2/2.25 Bath LUA AVG (Accuseal) Heparin 4600  Additional Objective Labs: Basic Metabolic Panel:  Recent Labs Lab 11/13/14 0931 11/13/14 1016 11/13/14 1511 11/13/14 1714 11/14/14 1259 11/15/14 0500  NA 138  --  137  --  139 137  K >7.5*  --  4.0  --  5.0 4.7  CL 98  --  99  --  99 97  CO2 17*  --   --   --  20 24  GLUCOSE 93  --  111*  --  90 109*  BUN 131*  --  91*  --  88* 42*  CREATININE 20.97*  --  15.50* 14.48* 16.42* 10.40*  CALCIUM 9.2  --   --   --  7.9* 7.9*  PHOS  --  9.1*  --  6.5*  --   --    Liver Function Tests:  Recent Labs Lab 11/13/14 1016  AST 25  ALT 19  ALKPHOS 1120*  BILITOT 0.8  PROT 7.3  ALBUMIN 3.6   CBC:  Recent Labs Lab 11/13/14 0931  11/13/14 1714 11/14/14 1259 11/15/14 0500  WBC 5.1  --  4.2 3.5* 4.1  HGB 9.4*  < > 8.3* 8.5* 8.3*  HCT 29.8*  < > 25.3* 26.2* 26.2*  MCV  97.1  --  92.3 93.9 93.6  PLT 131*  --  122* 121* 120*  < > = values in this interval not displayed. B Iron Studies:   Recent Labs  11/14/14 1622  IRON 51  TIBC 267   @[email or web address] @ Studies/Results: Dg Chest 2 View  11/14/2014   CLINICAL DATA:  Left-sided chest pain for 3 days, history of smoking  EXAM: CHEST  2 VIEW  COMPARISON:  11/13/2014  FINDINGS: Cardiac shadow remains enlarged. Left subclavian and innominate vein stenting is again identified. A persistent right basilar infiltrate is seen. No new focal infiltrates are noted. No acute bony abnormality is seen.  IMPRESSION: Persistent right basilar infiltrate.   Electronically Signed   By: Alcide CleverMark  Lukens M.D.   On: 11/14/2014 14:44   Medications:   . carvedilol  12.5 mg Oral BID WC  . cinacalcet  60 mg Oral Q supper  . darbepoetin (ARANESP) injection - DIALYSIS  60 mcg Intravenous Q Thu-HD  . darbepoetin (ARANESP) injection - DIALYSIS  60 mcg Intravenous Once  . enoxaparin (LOVENOX) injection  30 mg Subcutaneous Q24H  . famotidine  20 mg Oral Daily  . ferric gluconate (FERRLECIT/NULECIT) IV  125 mg  Intravenous Q T,Th,Sa-HD  . gabapentin  300 mg Oral QHS  . hydrALAZINE  25 mg Oral BID  . isosorbide dinitrate  20 mg Oral BID  . metoCLOPramide  5 mg Oral TID AC & HS  . nicotine  21 mg Transdermal Daily  . sevelamer carbonate  800 mg Oral TID WC  . sodium chloride  3 mL Intravenous Q12H

## 2014-11-17 DIAGNOSIS — T82590A Other mechanical complication of surgically created arteriovenous fistula, initial encounter: Secondary | ICD-10-CM | POA: Insufficient documentation

## 2014-11-17 DIAGNOSIS — M7989 Other specified soft tissue disorders: Secondary | ICD-10-CM | POA: Insufficient documentation

## 2014-11-17 LAB — COMPREHENSIVE METABOLIC PANEL
ALBUMIN: 3 g/dL — AB (ref 3.5–5.2)
ALK PHOS: 959 U/L — AB (ref 39–117)
ALT: 18 U/L (ref 0–53)
AST: 23 U/L (ref 0–37)
Anion gap: 16 — ABNORMAL HIGH (ref 5–15)
BILIRUBIN TOTAL: 0.8 mg/dL (ref 0.3–1.2)
BUN: 46 mg/dL — AB (ref 6–23)
CHLORIDE: 98 mmol/L (ref 96–112)
CO2: 20 mmol/L (ref 19–32)
Calcium: 8.5 mg/dL (ref 8.4–10.5)
Creatinine, Ser: 9.81 mg/dL — ABNORMAL HIGH (ref 0.50–1.35)
GFR, EST AFRICAN AMERICAN: 7 mL/min — AB (ref >90)
GFR, EST NON AFRICAN AMERICAN: 6 mL/min — AB (ref >90)
Glucose, Bld: 144 mg/dL — ABNORMAL HIGH (ref 70–99)
POTASSIUM: 4.4 mmol/L (ref 3.5–5.1)
SODIUM: 134 mmol/L — AB (ref 135–145)
Total Protein: 6.4 g/dL (ref 6.0–8.3)

## 2014-11-17 LAB — CBC
HCT: 25.6 % — ABNORMAL LOW (ref 39.0–52.0)
Hemoglobin: 8.3 g/dL — ABNORMAL LOW (ref 13.0–17.0)
MCH: 30.6 pg (ref 26.0–34.0)
MCHC: 32.4 g/dL (ref 30.0–36.0)
MCV: 94.5 fL (ref 78.0–100.0)
Platelets: 121 10*3/uL — ABNORMAL LOW (ref 150–400)
RBC: 2.71 MIL/uL — ABNORMAL LOW (ref 4.22–5.81)
RDW: 15 % (ref 11.5–15.5)
WBC: 4.4 10*3/uL (ref 4.0–10.5)

## 2014-11-17 LAB — HEMOGLOBIN A1C
Hgb A1c MFr Bld: 5.3 % (ref 4.8–5.6)
Mean Plasma Glucose: 105 mg/dL

## 2014-11-17 MED ORDER — DIPHENHYDRAMINE HCL 50 MG/ML IJ SOLN
25.0000 mg | Freq: Once | INTRAMUSCULAR | Status: DC
  Administered 2014-11-17: 25 mg via INTRAVENOUS

## 2014-11-17 MED ORDER — ISOSORBIDE DINITRATE 20 MG PO TABS
40.0000 mg | ORAL_TABLET | Freq: Two times a day (BID) | ORAL | Status: DC
  Administered 2014-11-17 – 2014-11-18 (×2): 40 mg via ORAL
  Filled 2014-11-17 (×6): qty 2

## 2014-11-17 MED ORDER — DIPHENHYDRAMINE HCL 50 MG/ML IJ SOLN
INTRAMUSCULAR | Status: AC
  Administered 2014-11-17: 25 mg via INTRAVENOUS
  Filled 2014-11-17: qty 1

## 2014-11-17 MED ORDER — ZOLPIDEM TARTRATE 5 MG PO TABS
5.0000 mg | ORAL_TABLET | Freq: Once | ORAL | Status: AC
  Administered 2014-11-17: 5 mg via ORAL
  Filled 2014-11-17: qty 1

## 2014-11-17 NOTE — Procedures (Signed)
I was present at this dialysis session, have reviewed the session itself and made  appropriate changes  Vinson Moselleob Alethea Terhaar MD (pgr) [phone]    [phone]) [phone] 11/17/2014, 2:41 PM

## 2014-11-17 NOTE — Progress Notes (Addendum)
Pt left the floor. Continues to be noncompliant with staying on the unit. States he will return. AC and M. Lynch NP made aware. Gilman Schmidtembrina, Kym Fenter J

## 2014-11-17 NOTE — Progress Notes (Signed)
PT Cancellation Note  Patient Details Name: Claiborne RiggRichard Busler MRN: [id number] DOB: 04/29/1986   Cancelled Treatment:    Reason Eval/Treat Not Completed: Patient at procedure or test/unavailable.  Pt was in HD when PT arrived.  Try again tomorrow.   Ivar DrapeStout, Shyann Hefner E 11/17/2014, 1:25 PM   Samul Dadauth Gildardo Tickner, PT MS Acute Rehab Dept. Number: [phone]

## 2014-11-17 NOTE — Progress Notes (Signed)
Alexander [name] Progress Note  Assessment: 1. 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 ESRD on HD since age 29 ; [place] and moved to [place], living w brother, wants to get HD in [place] now.  CLIP process has been initiated.  3. Anemia - Hgb 8s - start Aranesp 60, replete  IV Fe tsat 19%  4. Secondary hyperparathyroidism - no iPTH available - will check, supposedly taking renagel and sensipar 5. HTN/volume -  coreg/ hydralazine/ Imdur, still up 5kg 6. Nutrition - change diet to renal diet + vit K 5 7. Polysubstance abuse - tells me he moved here to get away from drug scene - living with family members who do not use  Plan - HD again today then Monday.  Get down to dry wt 63kg.     Vinson Moselleob Soliana Kitko MD pager [phone]    cell [phone] 11/17/2014, 10:19 AM   Subjective:   No new complaints  Objective Filed Vitals:   11/16/14 1830 11/16/14 1857 11/16/14 2101 11/17/14 0438  BP: 154/111 165/113 170/83 150/85  Pulse: 102 108 102 109  Temp:  98.3 F (36.8 C) 98.5 F (36.9 C) 97.7 F (36.5 C)  TempSrc:  Oral Oral Oral  Resp:  16 18 21   Height:      Weight:  68 kg (149 lb 14.6 oz)    SpO2:  98% 100% 100%   Physical Exam General: facial edema Heart: RRR Lungs: no rales Abdomen: soft Extremities: + 2 LUE edema, 1+ bilat LE edema, facial edema better Dialysis Access: left upper AVGG marked edema Neuro - is alert, Ox 3, NF  Dialysis Orders: 3.5h F180 400/800 62.5kg 2/2.25 Bath LUA AVG (Accuseal) Heparin 4600  Additional Objective Labs: Basic Metabolic Panel:  Recent Labs Lab 11/13/14 1016  11/13/14 1714 11/14/14 1259 11/15/14 0500 11/16/14 1539  NA  --   < >  --  139 137 135  K  --   < >  --  5.0 4.7 5.7*  CL  --   < >  --  99 97 99  CO2  --   --   --  20 24 17*  GLUCOSE  --   < >  --  90 109* 82  BUN  --   < >  --  88* 42* 53*  CREATININE  --   < > 14.48*  16.42* 10.40* 11.68*  CALCIUM  --   --   --  7.9* 7.9* 8.4  PHOS 9.1*  --  6.5*  --   --  7.3*  < > = values in this interval not displayed. Liver Function Tests:  Recent Labs Lab 11/13/14 1016 11/16/14 1539  AST 25  --   ALT 19  --   ALKPHOS 1120*  --   BILITOT 0.8  --   PROT 7.3  --   ALBUMIN 3.6 3.1*   CBC:  Recent Labs Lab 11/13/14 0931  11/13/14 1714 11/14/14 1259 11/15/14 0500 11/16/14 1539  WBC 5.1  --  4.2 3.5* 4.1 4.9  HGB 9.4*  < > 8.3* 8.5* 8.3* 8.4*  HCT 29.8*  < > 25.3* 26.2* 26.2* 26.2*  MCV 97.1  --  92.3 93.9 93.6 94.6  PLT 131*  --  122* 121* 120* 113*  < > = values in this interval not displayed. B Iron Studies:   Recent Labs  11/14/14 1622  IRON 51  TIBC 267   @ Medications:   . carvedilol  12.5 mg Oral BID WC  . cinacalcet  60 mg Oral Q supper  . darbepoetin (ARANESP) injection - DIALYSIS  60 mcg Intravenous Q Thu-HD  . enoxaparin (LOVENOX) injection  30 mg Subcutaneous Q24H  . famotidine  20 mg Oral Daily  . ferric gluconate (FERRLECIT/NULECIT) IV  125 mg Intravenous Q T,Th,Sa-HD  . gabapentin  300 mg Oral QHS  . hydrALAZINE  25 mg Oral BID  . isosorbide dinitrate  40 mg Oral BID  . metoCLOPramide  5 mg Oral TID AC & HS  . nicotine  21 mg Transdermal Daily  . sevelamer carbonate  800 mg Oral TID WC  . sodium chloride  3 mL Intravenous Q12H

## 2014-11-17 NOTE — Progress Notes (Signed)
Patient left the floor.  Told patient again that he does not have orders to leave the floor and must stay here.  Patient stated, "I don't care, I am going to go downstairs, no matter what you say."  Peri MarisAndrew Broedy Osbourne, MBA, BS, RN

## 2014-11-17 NOTE — Progress Notes (Signed)
Pt with complaints of itching severely and reports P.O benadryl is not helping. [facility] dose 25 mg IV benadryl administered per order. Dondra SpryMoore, Daralyn Bert Islee, RN

## 2014-11-17 NOTE — Progress Notes (Signed)
Patient requested to be able to leave the floor and go outside.  Informed patient that the he did not have an order to leave the floor & the MD stated that he could not leave the floor.  Pt. Stated "I don't care, I will go outside if I want to."  Informed patient again that he could not go outside.  At 10:20AM, discovered that the patient had left the room and was not on the unit.  MD made aware.  Peri MarisAndrew Diania Co, MBA, BS, RN

## 2014-11-17 NOTE — Progress Notes (Signed)
Patient came back to the floor.  Checked on patient @ 11:00 and noticed that the patient was gone again.  Peri MarisAndrew Azzan Butler, MBA, BS, RN

## 2014-11-17 NOTE — Progress Notes (Signed)
Pt is unable to sleep and request a sleep aid. [facility]. New order for Ambien 5 mg 1 time dose admin per order. Dondra SpryMoore, Jaeleah Smyser Islee, RN

## 2014-11-17 NOTE — Progress Notes (Signed)
[facility] PROGRESS NOTE  Alexander [name] JYN:[id number] DOB: 1986/07/13 DOA: 11/14/2014 PCP: No PCP Per Patient  29 year old African-American male with end-stage renal on HD TTS [place] disease who presented to the ED fluid overloaded from noncompliance with dialysis as an outpatient.  Assessment/Plan: 1. ESRD on HD - Nephrology on board managing NPO due to HD cath placement today.  AVF w excess bleeding and pain during HD prob d/t increased pressures from central restenosis Has stents in L SCV and L innominate vein per CXR. Interventional radiology to assess for attempt to salvage L arm access and if unable will need tunneled HD cath. Has had tunneled catheters before, including bilat chest and R femoral. Needs to be clipped  2. Anemia -  from chronic renal disease - Monitoring, no active bleeding Hgb 8s - start Aranesp    3. Hyperglycemia  hemoglobin A1c-5.3  continue to monitor CBGs  4. History of polysubstance abuse patient is not making any urine.  F/U serum drug screen. Pt wants to leave the floor- stated can not leave the floor with IV access.  5. Pain in the rt arm. Get xray. No redness.   Code Status: Full Family Communication: No family at bedside  Disposition Plan: Pending further recommendations from nephrology   Consultants:  Nephrology  Procedures:  Dialysis  Antibiotics:  None  HPI/Subjective: Patient ambulating the halls, still requesting physical therapy, C/o pain in the lt arm. States worsening of the pain.  Objective: Filed Vitals:   11/17/14 1558  BP: 148/101  Pulse: 100  Temp:   Resp: 15    Intake/Output Summary (Last 24 hours) at 11/17/14 1624 Last data filed at 11/17/14 8657  Gross per 24 hour  Intake    240 ml  Output   4070 ml  Net  -3830 ml   Filed Weights   11/16/14 1857 11/17/14 1217 11/17/14 1558  Weight: 68 kg (149 lb 14.6 oz) 70.9 kg (156 lb 4.9 oz) 68.2 kg (150 lb 5.7 oz)    Exam:   General:  Patient in  no acute distress alert and awake  Cardiovascular: Regular rate and rhythm, no murmurs or rubs  Respiratory: Clear to auscultation bilaterally, no wheezes  Abdomen: Soft, nondistended  Musculoskeletal: Normal muscle tone. Some hard object is palpable under the skin. C/o tenderness in the area.  Data Reviewed: Basic Metabolic Panel:  Recent Labs Lab 11/13/14 0931 11/13/14 1016 11/13/14 1511 11/13/14 1714 11/14/14 1259 11/15/14 0500 11/16/14 1539 11/17/14 1229  NA 138  --  137  --  139 137 135 134*  K >7.5*  --  4.0  --  5.0 4.7 5.7* 4.4  CL 98  --  99  --  99 97 99 98  CO2 17*  --   --   --  20 24 17* 20  GLUCOSE 93  --  111*  --  90 109* 82 144*  BUN 131*  --  91*  --  88* 42* 53* 46*  CREATININE 20.97*  --  15.50* 14.48* 16.42* 10.40* 11.68* 9.81*  CALCIUM 9.2  --   --   --  7.9* 7.9* 8.4 8.5  MG  --  2.3  --  2.0  --   --   --   --   PHOS  --  9.1*  --  6.5*  --   --  7.3*  --    Liver Function Tests:  Recent Labs Lab 11/13/14 1016 11/16/14 1539 11/17/14 1229  AST 25  --  23  ALT 19  --  18  ALKPHOS 1120*  --  959*  BILITOT 0.8  --  0.8  PROT 7.3  --  6.4  ALBUMIN 3.6 3.1* 3.0*   No results for input(s): LIPASE, AMYLASE in the last 168 hours. No results for input(s): AMMONIA in the last 168 hours. CBC:  Recent Labs Lab 11/13/14 1714 11/14/14 1259 11/15/14 0500 11/16/14 1539 11/17/14 1229  WBC 4.2 3.5* 4.1 4.9 4.4  HGB 8.3* 8.5* 8.3* 8.4* 8.3*  HCT 25.3* 26.2* 26.2* 26.2* 25.6*  MCV 92.3 93.9 93.6 94.6 94.5  PLT 122* 121* 120* 113* 121*   Cardiac Enzymes: No results for input(s): CKTOTAL, CKMB, CKMBINDEX, TROPONINI in the last 168 hours. BNP (last 3 results)  Recent Labs  11/13/14 0931  BNP 3040.1*    ProBNP (last 3 results) No results for input(s): PROBNP in the last 8760 hours.  CBG: No results for input(s): GLUCAP in the last 168 hours.  Recent Results (from the past 240 hour(s))  MRSA PCR Screening     Status: None   Collection  Time: 11/15/14  2:15 AM  Result Value Ref Range Status   MRSA by PCR NEGATIVE NEGATIVE Final    Comment:        The GeneXpert MRSA Assay (FDA approved for NASAL specimens only), is one component of a comprehensive MRSA colonization surveillance program. It is not intended to diagnose MRSA infection nor to guide or monitor treatment for MRSA infections.      Studies: Ir Pta Venous Left  11/16/2014   CLINICAL DATA:  End stage renal disease, left upper arm and facial swelling  FLUOROSCOPY TIME:  4 minutes 24 seconds  COMPARISON:  None  EXAM: DIALYSIS SHUNTOGRAM  VENOUS ANGIOPLASTY  TECHNIQUE: The procedure, risks (including but not limited to bleeding, infection, organ damage ), benefits, and alternatives were explained to the patient. Questions regarding the procedure were encouraged and answered. The patient understands and consents to the procedure. Survey ultrasound of the left upper arm graft was performed and an appropriate entry site was determined. Site prepped and draped in usual sterile fashion. Maximal barrier sterile technique was utilized including caps, mask, sterile gowns, sterile gloves, sterile drape, hand hygiene and skin antiseptic. The graft was accessed with a micropuncture set for dialysis fistulography. The micro dilator was exchanged over a Bentson wire for a 7 [place] vascular sheath. Through this, a 5 [place] Kumpe the catheter was advanced centrally for additional detailed venography. The Kumpe the was advanced into the IVC and exchanged over an Amplatz wire for a 14 mm by 4 cm atlas Angioplasty balloon, advanced to the level of tandem left subclavian and innominate vein stenoses for venous angioplasty using 60 second overlapping inflations . After follow-up venography and reflux shuntogram to assess the arterial anastomosis, the catheter, sheath, and guidewire were removed and hemostasis achieved with a 2-0 Ethilon purse-string suture. No immediate complication.  FINDINGS:  There is a synthetic left upper arm loop arterial graft, arterial limb radial, venous limb ulnar. 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 The graft is widely patent. Venous anastomosis patent. Outflow vein through the axillary vein widely patent. There are overlapping stents spanning the left subclavian and innominate veins. The component in the subclavian vein is markedly irregular and probably fragment. There are tandem moderately severe stenoses within the stents. Multiple enlarged thyrocervical and body wall venous channels provide collateral drainage of the left upper extremity, attesting to the  hemodynamic significance of the central stenoses. The SVC remains widely patent. Right subclavian vein is patent.  The left subclavian and innominate vein stenoses responded well to 14 mm balloon angioplasty.  Follow-up venography demonstrates good flow through the treated segments, with no significant residual/recurrent stenosis. Markedly decreased filling of the collateral drainage channels. The patient tolerated the procedure well.  COMPLICATIONS: None immediate  IMPRESSION: 1. 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 2. 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 No associated stenosis.  ACCESS: Remains approachable for percutaneous intervention as needed.   Electronically Signed   By: Corlis Leak M.D.   On: 11/16/2014 15:26   Ir US Guide Vasc Access Left  11/16/2014   CLINICAL DATA:  End stage renal disease, left upper arm and facial swelling  FLUOROSCOPY TIME:  4 minutes 24 seconds  COMPARISON:  None  EXAM: DIALYSIS SHUNTOGRAM  VENOUS ANGIOPLASTY  TECHNIQUE: The procedure, risks (including but not limited to bleeding, infection, organ damage ), benefits, and alternatives were explained to the patient. Questions regarding the procedure were encouraged and  answered. The patient understands and consents to the procedure. Survey ultrasound of the left upper arm graft was performed and an appropriate entry site was determined. Site prepped and draped in usual sterile fashion. Maximal barrier sterile technique was utilized including caps, mask, sterile gowns, sterile gloves, sterile drape, hand hygiene and skin antiseptic. The graft was accessed with a micropuncture set for dialysis fistulography. The micro dilator was exchanged over a Bentson wire for a 7 [place] vascular sheath. Through this, a 5 [place] Kumpe the catheter was advanced centrally for additional detailed venography. The Kumpe the was advanced into the IVC and exchanged over an Amplatz wire for a 14 mm by 4 cm atlas Angioplasty balloon, advanced to the level of tandem left subclavian and innominate vein stenoses for venous angioplasty using 60 second overlapping inflations . After follow-up venography and reflux shuntogram to assess the arterial anastomosis, the catheter, sheath, and guidewire were removed and hemostasis achieved with a 2-0 Ethilon purse-string suture. No immediate complication.  FINDINGS: There is a synthetic left upper arm loop arterial graft, arterial limb radial, venous limb ulnar. 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 The graft is widely patent. Venous anastomosis patent. Outflow vein through the axillary vein widely patent. There are overlapping stents spanning the left subclavian and innominate veins. The component in the subclavian vein is markedly irregular and probably fragment. There are tandem moderately severe stenoses within the stents. Multiple enlarged thyrocervical and body wall venous channels provide collateral drainage of the left upper extremity, attesting to the hemodynamic significance of the central stenoses. The SVC remains widely patent. Right subclavian vein is patent.  The left subclavian and innominate vein stenoses responded  well to 14 mm balloon angioplasty.  Follow-up venography demonstrates good flow through the treated segments, with no significant residual/recurrent stenosis. Markedly decreased filling of the collateral drainage channels. The patient tolerated the procedure well.  COMPLICATIONS: None immediate  IMPRESSION: 1. 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 2. 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 No associated stenosis.  ACCESS: Remains approachable for percutaneous intervention as needed.   Electronically Signed   By: Corlis Leak M.D.   On: 11/16/2014 15:26  Ir Shuntogram/ Fistulagram Left Mod Sed  11/16/2014   CLINICAL DATA:  End stage renal disease, left upper arm and facial swelling  FLUOROSCOPY TIME:  4 minutes 24 seconds  COMPARISON:  None  EXAM: DIALYSIS SHUNTOGRAM  VENOUS ANGIOPLASTY  TECHNIQUE: The procedure, risks (including but not limited to bleeding, infection, organ damage ), benefits, and alternatives were explained to the patient. Questions regarding the procedure were encouraged and answered. The patient understands and consents to the procedure. Survey ultrasound of the left upper arm graft was performed and an appropriate entry site was determined. Site prepped and draped in usual sterile fashion. Maximal barrier sterile technique was utilized including caps, mask, sterile gowns, sterile gloves, sterile drape, hand hygiene and skin antiseptic. The graft was accessed with a micropuncture set for dialysis fistulography. The micro dilator was exchanged over a Bentson wire for a 7 [place] vascular sheath. Through this, a 5 [place] Kumpe the catheter was advanced centrally for additional detailed venography. The Kumpe the was advanced into the IVC and exchanged over an Amplatz wire for a 14 mm by 4 cm atlas Angioplasty balloon, advanced to the level of tandem left subclavian and  innominate vein stenoses for venous angioplasty using 60 second overlapping inflations . After follow-up venography and reflux shuntogram to assess the arterial anastomosis, the catheter, sheath, and guidewire were removed and hemostasis achieved with a 2-0 Ethilon purse-string suture. No immediate complication.  FINDINGS: There is a synthetic left upper arm loop arterial graft, arterial limb radial, venous limb ulnar. 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 The graft is widely patent. Venous anastomosis patent. Outflow vein through the axillary vein widely patent. There are overlapping stents spanning the left subclavian and innominate veins. The component in the subclavian vein is markedly irregular and probably fragment. There are tandem moderately severe stenoses within the stents. Multiple enlarged thyrocervical and body wall venous channels provide collateral drainage of the left upper extremity, attesting to the hemodynamic significance of the central stenoses. The SVC remains widely patent. Right subclavian vein is patent.  The left subclavian and innominate vein stenoses responded well to 14 mm balloon angioplasty.  Follow-up venography demonstrates good flow through the treated segments, with no significant residual/recurrent stenosis. Markedly decreased filling of the collateral drainage channels. The patient tolerated the procedure well.  COMPLICATIONS: None immediate  IMPRESSION: 1. 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 2. 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 No associated stenosis.  ACCESS: Remains approachable for percutaneous intervention as needed.   Electronically Signed   By: Corlis Leak M.D.   On: 11/16/2014 15:26    Scheduled Meds: . carvedilol  12.5 mg Oral BID WC  . cinacalcet  60 mg Oral Q supper  . darbepoetin (ARANESP)  injection - DIALYSIS  60 mcg Intravenous Q Thu-HD  . enoxaparin (LOVENOX) injection  30 mg Subcutaneous Q24H  . famotidine  20 mg Oral Daily  . ferric gluconate (FERRLECIT/NULECIT) IV  125 mg Intravenous Q T,Th,Sa-HD  . gabapentin  300 mg Oral QHS  . hydrALAZINE  25 mg Oral BID  . isosorbide dinitrate  40 mg Oral BID  . metoCLOPramide  5 mg Oral TID AC & HS  . nicotine  21 mg Transdermal Daily  . sevelamer carbonate  800 mg Oral TID WC  . sodium chloride  3 mL Intravenous Q12H  Continuous Infusions:   Principal Problem:   Volume overload Active Problems:   ESRD (end stage renal disease) on dialysis   Hypertension   Tobacco abuse   Chronic pain of left upper extremity    Time spent: > .     [facility]  [facility] Pager [phone]. If 7PM-7AM, please contact night-coverage at [email or web address], password [facility] 11/17/2014, 4:24 PM  LOS: 2 days

## 2014-11-17 NOTE — Progress Notes (Signed)
Pt has returned to the floor. Appears to be SOB with exertion. Instructed the patient to remain in his room. Will continue to monitor. Gilman Schmidtembrina, Tayt Moyers J

## 2014-11-17 NOTE — Progress Notes (Signed)
After searching for the patient for 20 minutes, found patient coming inside at the ER entrance.  Reiterated to patient that he needs to stay on the floor, so that he won't missed out on any treatments and because he does not have an order to leave the floor.  Peri MarisAndrew Jamario Colina, MBA, BS, RN

## 2014-11-18 ENCOUNTER — Inpatient Hospital Stay (HOSPITAL_COMMUNITY): Payer: Medicaid Other

## 2014-11-18 DIAGNOSIS — N189 Chronic kidney disease, unspecified: Secondary | ICD-10-CM

## 2014-11-18 DIAGNOSIS — N186 End stage renal disease: Secondary | ICD-10-CM

## 2014-11-18 DIAGNOSIS — Z992 Dependence on renal dialysis: Secondary | ICD-10-CM

## 2014-11-18 LAB — LACTIC ACID, PLASMA: LACTIC ACID, VENOUS: 1.4 mmol/L (ref 0.5–2.0)

## 2014-11-18 MED ORDER — RENA-VITE PO TABS
1.0000 | ORAL_TABLET | Freq: Every day | ORAL | Status: DC
  Filled 2014-11-18: qty 1

## 2014-11-18 MED ORDER — FUROSEMIDE 10 MG/ML IJ SOLN: 30.0000 mg | Freq: Once | INTRAMUSCULAR | Status: DC

## 2014-11-18 MED ORDER — CYCLOBENZAPRINE HCL 5 MG PO TABS
5.0000 mg | ORAL_TABLET | Freq: Once | ORAL | Status: AC
  Administered 2014-11-18: 5 mg via ORAL
  Filled 2014-11-18: qty 1

## 2014-11-18 NOTE — Progress Notes (Signed)
Patient left the unit without permission.  Peri MarisAndrew Gentle Hoge, MBA, BS, RN

## 2014-11-18 NOTE — Progress Notes (Addendum)
Pt walked off the unit in wheelchair with girlfriend. Alexander [name], Alexander [name]

## 2014-11-18 NOTE — Progress Notes (Signed)
PT Cancellation Note  Patient Details Name: Alexander [name] MRN: [id number] DOB: 10/28/1985   Cancelled Treatment:    Reason Eval/Treat Not Completed: Other (comment)   Noted pt is currently off the floor, having left the unit without permission;   Will follow up later today as time allows;  Otherwise, will follow up for PT tomorrow;   Thank you,  Van ClinesHolly Tushar Enns, PT  Acute Rehabilitation Services Pager [phone] Office [phone]      Van ClinesGarrigan, Carnie Bruemmer [facility] Center[facility] 11/18/2014, 2:05 PM

## 2014-11-18 NOTE — Progress Notes (Addendum)
Pt returned to floor in wheelchair with girlfriend. Respiratory distress noted with increased WOB. Vitals: Blood pressure 168/88, pulse 89, temperature 98.1 F (36.7 C), resp. rate 28, SpO2 100 %. Increased swelling +1 edema generalized with facial swelling. Crackles, wheezes bilaterally at bases. Jennye BoroughsS. Osman notified. April RN from rapid called to assess. New orders placed by [place] MD. Hemodialysis ordered for tonight. Will continue to monitor.Gilman Schmidtembrina, Carle Fenech J

## 2014-11-18 NOTE — Evaluation (Signed)
Physical Therapy Evaluation Patient Details Name: Alexander [name] MRN: [id number] DOB: January 04, 1986 Today's Date: 11/18/2014   History of Present Illness  Admitted with swelling, SOB after missing a considerable amount of HD  Past Medical History  Diagnosis Date  . Hypertension   . ESRD on hemodialysis 11/13/2014    Per patient had childhood kidney disease, not sure etiology.  Around age 29 was started on RRT with PD for a short time, then changed to HD which he says he has been doing for about 16 years.  Has a LUA AVF as of Feb 2016.      History reviewed. No pertinent past surgical history.   Clinical Impression  Patient evaluated by Physical Therapy with no further acute PT needs identified. All education has been completed and the patient has no further questions. Pain limits his activity tolerance, but he is overall modified independt with functional mobility and ambulation;   Emphasized to pt to take control of his care and to follow up with an Orthopedic MD for further assessment of his bil knee, hip, and foot pain; It is worth considering an Ortho Consult while pt is in house, to help assess, and moreover, get him hooked in with an Orthopedic practice to help assess and manage any musculoskeletal dysfunctions and ensure continued care. also during session, pt became quite tearful; Possible depression? Please address.   See below for any follow-up Physical Therapy or equipment needs. PT is signing off. Thank you for this referral.     Follow Up Recommendations Outpatient PT    Equipment Recommendations  Rolling walker with 5" wheels    Recommendations for Other Services Other (comment) (Ortho Consult); Consider SW consult    Precautions / Restrictions Precautions Precautions: None      Mobility  Bed Mobility                  Transfers Overall transfer level: Modified independent Equipment used: None;Rolling walker (2 wheeled)             General transfer  comment: slow moving, and painful, but independent  Ambulation/Gait Ambulation/Gait assistance: Modified independent (Device/Increase time) Ambulation Distance (Feet): 120 Feet Assistive device: Rolling walker (2 wheeled) Gait Pattern/deviations: Step-through pattern Gait velocity: slowed and painful   General Gait Details: Supervision initially, but progressing to modified independent using RW; painful, and tearful as we talked; no need for physical assist  Stairs Stairs:  (Pt is confident in his ability to manage)          Wheelchair Mobility    Modified Rankin (Stroke Patients Only)       Balance                                             Pertinent Vitals/Pain Pain Assessment: 0-10 Pain Score: 10-Worst pain ever Pain Location: bil knees, hips, dorsum of feet; pt attributes this to no cartilage in knees; states it's "bone on bone" Pain Descriptors / Indicators: Aching (with walking) Pain Intervention(s): Monitored during session;Other (comment) (used RW to [place] painful LEs during amb)    Home Living Family/patient expects to be discharged to:: Private residence Living Arrangements: Other relatives Available Help at Discharge: Family;Available PRN/intermittently Type of Home: Apartment Home Access: Stairs to enter Entrance Stairs-Rails:  (Pt reports no problem negotiating stairs at home) Entrance Stairs-Number of Steps: 2 Home Layout: One level Home Equipment:  None Additional Comments: Recently moved to [place] from [place] to "get a better life"    Prior Function Level of Independence: Independent               Hand Dominance        Extremity/Trunk Assessment   Upper Extremity Assessment: Overall WFL for tasks assessed           Lower Extremity Assessment: Generalized weakness (bil LEs painful with WB/walking)         Communication   Communication: Expressive difficulties (face swollen, at times difficult to understand)   Cognition Arousal/Alertness: Awake/alert Behavior During Therapy: WFL for tasks assessed/performed (Tearful when talking about his past) Overall Cognitive Status: Within Functional Limits for tasks assessed                      General Comments      Exercises        Assessment/Plan    PT Assessment All further PT needs can be met in the next venue of care  PT Diagnosis Difficulty walking;Acute pain   PT Problem List Decreased strength;Decreased activity tolerance;Decreased balance;Pain  PT Treatment Interventions     PT Goals (Current goals can be found in the Care Plan section) Acute Rehab PT Goals Patient Stated Goal: Wants to turn his life around PT Goal Formulation: All assessment and education complete, DC therapy    Frequency     Barriers to discharge        Co-evaluation               End of Session   Activity Tolerance: Patient tolerated treatment well;Patient limited by pain Patient left: in chair;with call bell/phone within reach;with family/visitor present Nurse Communication: Mobility status;Patient requests pain meds (RN reports pt had Tyleno earlier)         Time: 5732-2025 PT Time Calculation (min) (ACUTE ONLY): 19 min   Charges:   PT Evaluation $Initial PT Evaluation Tier I: 1 Procedure     PT G CodesRoney Marion Hamff 11/18/2014, 4:17 PM  Roney Marion, PT  Acute Rehabilitation Services Pager [phone] Office [phone]

## 2014-11-18 NOTE — Progress Notes (Signed)
[facility] PROGRESS NOTE  Alexander [name] ZOX:[id number] DOB: 12-28-1985 DOA: 11/14/2014 PCP: No PCP Per Patient  29 year old African-American male with end-stage renal on HD [facility] [place] disease who presented to the ED fluid overloaded from noncompliance with dialysis as an outpatient.  Assessment/Plan: 1ESRD on HD == Nephrology on board managing ==AVF w excess bleeding and pain during HD prob d/t increased pressures from central restenosis Has stents in L SCV and L innominate vein per CXR. Interventional radiology to assess for attempt to salvage L arm access and if unable will need tunneled HD cath. Has had tunneled catheters before, including bilat chest and R femoral.   2. Anemia -  from chronic renal disease - Monitoring, no active bleeding Hgb 8s - start Aranesp    3. Hyperglycemia  hemoglobin A1c-5.3  continue to monitor CBGs  4. History of polysubstance abuse patient is not making any urine.  F/U serum drug screen- pending Pt wants to leave the floor- emphasized with patient can not leave the floor with IV access.  5. Pain in the rt arm.  No redness.stable   Code Status: Full Family Communication: No family at bedside  Disposition Plan: Pending further recommendations from nephrology   Consultants:  Nephrology  Procedures:  Dialysis  Antibiotics:  None  HPI/Subjective: Patient ambulating the halls, still requesting physical therapy, C/o pain in the lt arm. States worsening of the pain.  Objective: Filed Vitals:   11/18/14 0951  BP: 153/93  Pulse:   Temp: 98.1 F (36.7 C)  Resp: 18    Intake/Output Summary (Last 24 hours) at 11/18/14 1708 Last data filed at 11/18/14 0300  Gross per 24 hour  Intake    480 ml  Output      0 ml  Net    480 ml   Filed Weights   11/16/14 1857 11/17/14 1217 11/17/14 1558  Weight: 68 kg (149 lb 14.6 oz) 70.9 kg (156 lb 4.9 oz) 68.2 kg (150 lb 5.7 oz)    Exam:   General:  Patient in no acute distress  alert and awake  Cardiovascular: Regular rate and rhythm, no murmurs or rubs  Respiratory: Clear to auscultation bilaterally, no wheezes  Abdomen: Soft, nondistended  Musculoskeletal: Normal muscle tone. Some hard object is palpable under the skin. C/o tenderness in the area.  Data Reviewed: Basic Metabolic Panel:  Recent Labs Lab 11/13/14 0931 11/13/14 1016 11/13/14 1511 11/13/14 1714 11/14/14 1259 11/15/14 0500 11/16/14 1539 11/17/14 1229  NA 138  --  137  --  139 137 135 134*  K >7.5*  --  4.0  --  5.0 4.7 5.7* 4.4  CL 98  --  99  --  99 97 99 98  CO2 17*  --   --   --  20 24 17* 20  GLUCOSE 93  --  111*  --  90 109* 82 144*  BUN 131*  --  91*  --  88* 42* 53* 46*  CREATININE 20.97*  --  15.50* 14.48* 16.42* 10.40* 11.68* 9.81*  CALCIUM 9.2  --   --   --  7.9* 7.9* 8.4 8.5  MG  --  2.3  --  2.0  --   --   --   --   PHOS  --  9.1*  --  6.5*  --   --  7.3*  --    Liver Function Tests:  Recent Labs Lab 11/13/14 1016 11/16/14 1539 11/17/14 1229  AST 25  --  23  ALT 19  --  18  ALKPHOS 1120*  --  959*  BILITOT 0.8  --  0.8  PROT 7.3  --  6.4  ALBUMIN 3.6 3.1* 3.0*   No results for input(s): LIPASE, AMYLASE in the last 168 hours. No results for input(s): AMMONIA in the last 168 hours. CBC:  Recent Labs Lab 11/13/14 1714 11/14/14 1259 11/15/14 0500 11/16/14 1539 11/17/14 1229  WBC 4.2 3.5* 4.1 4.9 4.4  HGB 8.3* 8.5* 8.3* 8.4* 8.3*  HCT 25.3* 26.2* 26.2* 26.2* 25.6*  MCV 92.3 93.9 93.6 94.6 94.5  PLT 122* 121* 120* 113* 121*   Cardiac Enzymes: No results for input(s): CKTOTAL, CKMB, CKMBINDEX, TROPONINI in the last 168 hours. BNP (last 3 results)  Recent Labs  11/13/14 0931  BNP 3040.1*    ProBNP (last 3 results) No results for input(s): PROBNP in the last 8760 hours.  CBG: No results for input(s): GLUCAP in the last 168 hours.  Recent Results (from the past 240 hour(s))  MRSA PCR Screening     Status: None   Collection Time: 11/15/14   2:15 AM  Result Value Ref Range Status   MRSA by PCR NEGATIVE NEGATIVE Final    Comment:        The GeneXpert MRSA Assay (FDA approved for NASAL specimens only), is one component of a comprehensive MRSA colonization surveillance program. It is not intended to diagnose MRSA infection nor to guide or monitor treatment for MRSA infections.      Studies: No results found.  Scheduled Meds: . carvedilol  12.5 mg Oral BID WC  . cinacalcet  60 mg Oral Q supper  . darbepoetin (ARANESP) injection - DIALYSIS  60 mcg Intravenous Q Thu-HD  . enoxaparin (LOVENOX) injection  30 mg Subcutaneous Q24H  . famotidine  20 mg Oral Daily  . ferric gluconate (FERRLECIT/NULECIT) IV  125 mg Intravenous Q T,Th,Sa-HD  . gabapentin  300 mg Oral QHS  . hydrALAZINE  25 mg Oral BID  . isosorbide dinitrate  40 mg Oral BID  . metoCLOPramide  5 mg Oral TID AC & HS  . multivitamin  1 tablet Oral QHS  . nicotine  21 mg Transdermal Daily  . sevelamer carbonate  800 mg Oral TID WC   Continuous Infusions:   Principal Problem:   Volume overload Active Problems:   ESRD (end stage renal disease) on dialysis   Hypertension   Tobacco abuse   Chronic pain of left upper extremity   Dialysis AV fistula malfunction   Left arm swelling    Time spent: > 35min.     [facility] Asc Partners LLCVASIREDDY,Dejanay Wamboldt  [facility] Pager [phone]. If 7PM-7AM, please contact night-coverage at [email or web address], password [email or web address] 11/18/2014, 5:08 PM  LOS: 3 days

## 2014-11-18 NOTE — Progress Notes (Signed)
Subjective:  No specific complaints, but wants dialysis today, although had four straight days  Objective: Vital signs in last 24 hours: Temp:  [97.8 F (36.6 C)-98.5 F (36.9 C)] 98.1 F (36.7 C) (02/14 0455) Pulse Rate:  [97-103] 103 (02/14 0455) Resp:  [15-20] 19 (02/14 0455) BP: (127-192)/(71-119) 133/71 mmHg (02/14 0455) SpO2:  [97 %-100 %] 97 % (02/14 0455) Weight:  [68.2 kg (150 lb 5.7 oz)-70.9 kg (156 lb 4.9 oz)] 68.2 kg (150 lb 5.7 oz) (02/13 1558) Weight change: -0.9 kg (-1 lb 15.7 oz)  Intake/Output from previous day: 02/13 0701 - 02/14 0700 In: 480 [P.O.:480] Out: -  Intake/Output this shift:   Lab Results:  Recent Labs  11/16/14 1539 11/17/14 1229  WBC 4.9 4.4  HGB 8.4* 8.3*  HCT 26.2* 25.6*  PLT 113* 121*   BMET:  Recent Labs  11/16/14 1539 11/17/14 1229  NA 135 134*  K 5.7* 4.4  CL 99 98  CO2 17* 20  GLUCOSE 82 144*  BUN 53* 46*  CREATININE 11.68* 9.81*  CALCIUM 8.4 8.5  ALBUMIN 3.1* 3.0*   No results for input(s): PTH in the last 72 hours. Iron Studies: No results for input(s): IRON, TIBC, TRANSFERRIN, FERRITIN in the last 72 hours.  Studies/Results: No results found.  EXAM: General appearance:  Mild facial edema, alert, in no apparent distress Resp:  CTA without rales, rhonchi, or wheezes Cardio:  RRR without murmur or rub GI:  + BS, soft and nontender Extremities:  +2 LUE edema, B 1+ LE edema Access:  AVG @ LUA with + bruit  Dialysis Orders: 3.5h F180 400/800 62.5kg 2/2.25 Bath LUA AVG (Accuseal) Heparin 4600  Assessment/Plan: 1. 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  2. ESRD - HD since Age 29, recently [place] without arranging outpatient HD, CILP process initiated. 3. HTN/Volume - BP 133/71 on Carvedilol 12.5 mg bid, Hydralazine 25 mg bid; wt down from 76.9 kg on 2/10 to 68.2 after 4 days of HD. 4. Anemia - Hgb 8.3, Aranesp 60 mcg on  Thurs, Fe per HD. 5. Sec HPT - Ca 8.5 (9.3 corrected), P 7.3; Sensipar 60 mg qd, Renvela with meals. 6. Nutrition - Alb 3, renal carb-mod diet, vitamin. 7. Polysubstance abuse - moved to get away from drug scene, now with family members who do not use drugs   LOS: 3 days   [name],[name] 11/18/2014,7:52 AM  Pt seen, examined and agree w A/P as above. Feels "a ton better" since having angioplasty of venous stenoses.  Facial swelling still apparent.  He says he has had the OSA symptoms for many years and they told him long ago he has a "big tongue" that falls back in his throat.  Says he had to leave [facility] as he wants to get away from drugs (nasal cocaine) and get his life straight, he has a brother here who he is staying with. He says he never been a behavior problem [facility] and that he had a good relationship with the nephrologist there, Dr Vear ClockPhillips.  He says the only problematic issue he has is signing off early frequently due to severe cramping.  Says his wt gains are typically 2- 2.5kg.  Has been on HD for 16 years and has good health literacy.  CLIP is pending.   [name] MD pager [phone]    cell [phone] 11/18/2014, 1:25 PM

## 2014-11-18 NOTE — Progress Notes (Signed)
Patient left the unit without an order.  RN & MD both instructed patient not to leave.  Patient non-compliant with instructions.  Peri MarisAndrew Anais Denslow, MBA, BS, RN

## 2014-11-19 LAB — CBC
HCT: 26.1 % — ABNORMAL LOW (ref 39.0–52.0)
Hemoglobin: 8.4 g/dL — ABNORMAL LOW (ref 13.0–17.0)
MCH: 31 pg (ref 26.0–34.0)
MCHC: 32.2 g/dL (ref 30.0–36.0)
MCV: 96.3 fL (ref 78.0–100.0)
PLATELETS: 130 10*3/uL — AB (ref 150–400)
RBC: 2.71 MIL/uL — ABNORMAL LOW (ref 4.22–5.81)
RDW: 15.1 % (ref 11.5–15.5)
WBC: 6 10*3/uL (ref 4.0–10.5)

## 2014-11-19 LAB — BRAIN NATRIURETIC PEPTIDE: B Natriuretic Peptide: 3969.3 pg/mL — ABNORMAL HIGH (ref 0.0–100.0)

## 2014-11-19 LAB — RENAL FUNCTION PANEL
Albumin: 2.9 g/dL — ABNORMAL LOW (ref 3.5–5.2)
Anion gap: 12 (ref 5–15)
BUN: 37 mg/dL — AB (ref 6–23)
CALCIUM: 8 mg/dL — AB (ref 8.4–10.5)
CHLORIDE: 98 mmol/L (ref 96–112)
CO2: 25 mmol/L (ref 19–32)
CREATININE: 8.97 mg/dL — AB (ref 0.50–1.35)
GFR, EST AFRICAN AMERICAN: 8 mL/min — AB (ref >90)
GFR, EST NON AFRICAN AMERICAN: 7 mL/min — AB (ref >90)
Glucose, Bld: 126 mg/dL — ABNORMAL HIGH (ref 70–99)
POTASSIUM: 4.5 mmol/L (ref 3.5–5.1)
Phosphorus: 6.3 mg/dL — ABNORMAL HIGH (ref 2.3–4.6)
Sodium: 135 mmol/L (ref 135–145)

## 2014-11-19 MED ORDER — SODIUM CHLORIDE 0.9 % IV SOLN: 100.0000 mL | INTRAVENOUS | Status: DC | PRN

## 2014-11-19 MED ORDER — ALTEPLASE 2 MG IJ SOLR: 2.0000 mg | Freq: Once | INTRAMUSCULAR | Status: DC | PRN

## 2014-11-19 MED ORDER — NEPRO/CARBSTEADY PO LIQD: 237.0000 mL | ORAL | Status: DC | PRN

## 2014-11-19 MED ORDER — LIDOCAINE HCL (PF) 1 % IJ SOLN: 5.0000 mL | INTRAMUSCULAR | Status: DC | PRN

## 2014-11-19 MED ORDER — HEPARIN SODIUM (PORCINE) 1000 UNIT/ML DIALYSIS: 4600.0000 [IU] | Freq: Once | INTRAMUSCULAR | Status: DC

## 2014-11-19 MED ORDER — HEPARIN SODIUM (PORCINE) 1000 UNIT/ML DIALYSIS: 1000.0000 [IU] | INTRAMUSCULAR | Status: DC | PRN

## 2014-11-19 MED ORDER — LIDOCAINE-PRILOCAINE 2.5-2.5 % EX CREA: 1.0000 "application " | TOPICAL_CREAM | CUTANEOUS | Status: DC | PRN

## 2014-11-19 MED ORDER — PENTAFLUOROPROP-TETRAFLUOROETH EX AERO: 1.0000 "application " | INHALATION_SPRAY | CUTANEOUS | Status: DC | PRN

## 2014-11-19 MED ORDER — ALTEPLASE 2 MG IJ SOLR
2.0000 mg | Freq: Once | INTRAMUSCULAR | Status: DC | PRN
  Filled 2014-11-19: qty 2

## 2014-11-19 MED ORDER — ALTEPLASE 2 MG IJ SOLR
2.0000 mg | Freq: Once | INTRAMUSCULAR | Status: DC | PRN
Start: 2014-11-19 — End: 2014-11-19

## 2014-11-19 MED ORDER — NEPRO/CARBSTEADY PO LIQD
237.0000 mL | ORAL | Status: DC | PRN
  Filled 2014-11-19: qty 237

## 2014-11-19 MED ORDER — HEPARIN SODIUM (PORCINE) 1000 UNIT/ML DIALYSIS
3000.0000 [IU] | INTRAMUSCULAR | Status: DC | PRN
  Administered 2014-11-19: 3000 [IU] via INTRAVENOUS_CENTRAL
  Filled 2014-11-19: qty 3

## 2014-11-19 MED ORDER — HEPARIN SODIUM (PORCINE) 1000 UNIT/ML DIALYSIS
4600.0000 [IU] | Freq: Once | INTRAMUSCULAR | Status: DC
  Filled 2014-11-19: qty 5

## 2014-11-19 NOTE — Progress Notes (Signed)
11/19/2014 2:53 PM Hemodialysis Outpatient Note; I had begun the CLIP process for this patient on February 11, at that time I initiated paperwork to the [facility] units locally and also to the [facility]. I have spoken to Su HoffKim Atkins at the [facility] units and she acknowledged the patient might be a difficult placement due to his apparent non-compliance which echos the sentiment from the local [facility] units. I will continue to follow up until I hear a definite yes or no. Thank you.Tilman NeatKrzywonos, Kenyanna Grzesiak H

## 2014-11-19 NOTE — Progress Notes (Signed)
11/19/2014 3:30 PM  Spoke with a representative from outpatient dialysis center in [place] about whether or not his spot was still available.  According to her, he still has a spot available at [place] dialysis center in [place] if he wishes to return.  Pt and MD made aware.  Theadora RamaKIRKMAN, Mitchelle Goerner Brooke

## 2014-11-19 NOTE — Discharge Summary (Signed)
Physician Discharge Summary  Alexander [name] ZOX:[id number] DOB: 1985/11/19 DOA: 11/14/2014  PCP: No PCP Per Patient  Admit date: 11/14/2014 Discharge date: 11/19/2014  Recommendations for Outpatient Follow-up:  1. Pt will need to follow up with PCP in 2-3 weeks post discharge 2. Please obtain BMP to evaluate electrolytes and kidney function 3. Please also check CBC to evaluate Hg and Hct levels 4. Pt wants to leave today and has HD scheduled at [facility]   Discharge Diagnoses:  Principal Problem:   Volume overload Active Problems:   ESRD (end stage renal disease) on dialysis   Hypertension   Tobacco abuse   Chronic pain of left upper extremity   Dialysis AV fistula malfunction   Left arm swelling   Discharge Condition: Stable  Diet recommendation: Heart healthy diet discussed in details   History of present illness:  Brief narrative:    29 y.o. male with childhood renal disease on HD for prolonged period of time, previously living in [place] and moved to [place] with his brother and has not had a chance to establish with a dialysis center here. He presented to ED with dyspnea and diffuse swelling 11/13/2014 and underwent quick HD, d/c home with an outpatient follow up but has come back 2/10 due to worsening swelling and dyspnea at rest and with exertion. After being evaluated by nephrology, was recommended to come in for formal full dialysis. Lab work notable for hyperkalemia and hyperphosphatemia without any EKG changes. Vital signs were stable.  Assessment/Plan:    ESRD on HD - AVF with excess bleeding and pain during HD prob d/t increased pressures from central restenosis - outpatient HD set up at [facility], pt wants to leave today  - Recurrent venous stenosis at left [facility] and L innominate with multiple overlapping stents s/p intervention 2/12 arm and facial edema improving though stll some; expect this will be an ongoing issue, has multiple collat  Secondary  hyperparathyroidism  - continue sensipar and renvela  HTN - continue Coreg, Hydralazine, Isordil  - may need to increase the dosage of either for better blood pressure control   Anemia of chronic renal disease  - no signs of active bleeding - continue aransep and ferric gluconate IV, given with HD  Thrombocytopenia - Plt counts improving   Hyperkalemia - K is now WNL   Hyperglycemia hemoglobin A1c-5.3  History of polysubstance abuse - consultation on cessation provided and pt verbalizing understanding   SQ  Code Status: Full.  Family Communication: plan of care discussed with the patient Disposition Plan: Home   IV access:  Left upper AVG - + bruits   Procedures and diagnostic studies:    Dg Chest 2 View 11/14/2014 Persistent right basilar infiltrate.   Ir Pta Venous Left 11/16/2014 Tandem moderately severe stenoses in stented segments of the left subclavian and innominate veins, with good response to 14 mm balloon angioplasty. 2. 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 No associated stenosis. ACCESS: Remains approachable for percutaneous intervention as needed.   Dg Chest Port 1 View 11/13/2014 Cardiomegaly with slight pulmonary vascular congestion. 2. Slight diffuse haziness in the right lung which could represent pneumonitis or pulmonary edema.   Medical Consultants:  Nephrology Vascular surgery   Other Consultants:  None   IAnti-Infectives:   None       Discharge Exam: Filed Vitals:   11/19/14 1000  BP: 154/98  Pulse: 95  Temp: 97.6 F (36.4 C)  Resp: 24   Filed Vitals:  11/19/14 0300 11/19/14 0316 11/19/14 0335 11/19/14 1000  BP: 152/96 124/76 140/93 154/98  Pulse: 88 90 101 95  Temp:   98 F (36.7 C) 97.6 F (36.4 C)  TempSrc:   Oral Oral  Resp: Height:      Weight:  67.3 kg (148 lb 5.9 oz)    SpO2:    94%    General: Pt is alert, follows commands appropriately, not in acute  distress Cardiovascular: Regular rate and rhythm, no rubs, no gallops Respiratory: Clear to auscultation bilaterally, no wheezing, no crackles, no rhonchi Abdominal: Soft, non tender, non distended, bowel sounds +, no guarding Extremities: no cyanosis, pulses palpable bilaterally DP and PT Neuro: Grossly nonfocal  Discharge Instructions  Discharge Instructions    Diet - low sodium heart healthy    Complete by:  As directed      Increase activity slowly    Complete by:  As directed      Vital signs    Complete by:  As directed   Upon arrival to nursing area and as patient's condition warrants            Medication List    TAKE these medications        carvedilol 25 MG tablet  Commonly known as:  COREG  Take 25 mg by mouth 2 (two) times daily with a meal.     cinacalcet 60 MG tablet  Commonly known as:  SENSIPAR  Take 60 mg by mouth daily.     famotidine 20 MG tablet  Commonly known as:  PEPCID  Take 20 mg by mouth daily.     hydrALAZINE 50 MG tablet  Commonly known as:  APRESOLINE  Take 50 mg by mouth 2 (two) times daily.     isosorbide dinitrate 40 MG tablet  Commonly known as:  ISORDIL  Take 40 mg by mouth 2 (two) times daily.     metoCLOPramide 5 MG tablet  Commonly known as:  REGLAN  Take 5 mg by mouth 4 (four) times daily -  before meals and at bedtime.     NIFEdipine 60 MG 24 hr tablet  Commonly known as:  PROCARDIA XL/ADALAT-CC  Take 60 mg by mouth daily.     sevelamer 800 MG tablet  Commonly known as:  RENAGEL  Take 800 mg by mouth 3 (three) times daily with meals.           Follow-up Information    Follow up with Debbora Presto, MD.   Specialty:  Internal Medicine   Why:  As needed, If symptoms worsen   Contact information:   [address] [place] [address] [phone]        The results of significant diagnostics from this hospitalization (including imaging, microbiology, ancillary and laboratory) are  listed below for reference.     Microbiology: Recent Results (from the past 240 hour(s))  MRSA PCR Screening     Status: None   Collection Time: 11/15/14  2:15 AM  Result Value Ref Range Status   MRSA by PCR NEGATIVE NEGATIVE Final   Labs: Basic Metabolic Panel:  Recent Labs Lab 11/13/14 1016  11/13/14 1714 11/14/14 1259 11/15/14 0500 11/16/14 1539 11/17/14 1229 11/19/14 0138  NA  --   < >  --  139 137 135 134* 135  K  --   < >  --  5.0 4.7 5.7* 4.4 4.5  CL  --   < >  --  99 97 99 98 98  CO2  --   --   --  20 24 17* 20 25  GLUCOSE  --   < >  --  90 109* 82 144* 126*  BUN  --   < >  --  88* 42* 53* 46* 37*  CREATININE  --   < > 14.48* 16.42* 10.40* 11.68* 9.81* 8.97*  CALCIUM  --   --   --  7.9* 7.9* 8.4 8.5 8.0*  MG 2.3  --  2.0  --   --   --   --   --   PHOS 9.1*  --  6.5*  --   --  7.3*  --  6.3*  < > = values in this interval not displayed. Liver Function Tests:  Recent Labs Lab 11/13/14 1016 11/16/14 1539 11/17/14 1229 11/19/14 0138  AST 25  --  23  --   ALT 19  --  18  --   ALKPHOS 1120*  --  959*  --   BILITOT 0.8  --  0.8  --   PROT 7.3  --  6.4  --   ALBUMIN 3.6 3.1* 3.0* 2.9*   CBC:  Recent Labs Lab 11/14/14 1259 11/15/14 0500 11/16/14 1539 11/17/14 1229 11/19/14 0138  WBC 3.5* 4.1 4.9 4.4 6.0  HGB 8.5* 8.3* 8.4* 8.3* 8.4*  HCT 26.2* 26.2* 26.2* 25.6* 26.1*  MCV 93.9 93.6 94.6 94.5 96.3  PLT 121* 120* 113* 121* 130*    BNP (last 3 results)  Recent Labs  11/13/14 0931 11/18/14 2235  BNP 3040.1* 3969.3*    SIGNED: Time coordinating discharge: Over 30 minutes  Debbora PrestoMAGICK-Loucille Takach, MD  [facility] 11/19/2014, 3:41 PM Pager [phone]  If 7PM-7AM, please contact night-coverage [email or web address] Password [email or web address]

## 2014-11-19 NOTE — Discharge Instructions (Signed)
End-Stage Kidney Disease °The kidneys are two organs that lie on either side of the spine between the middle of the back and the front of the abdomen. The kidneys:  °· Remove wastes and extra water from the blood.   °· Produce important hormones. These help keep bones strong, regulate blood pressure, and help create red blood cells.   °· Balance the fluids and chemicals in the blood and tissues. °End-stage kidney disease occurs when the kidneys are so damaged that they cannot do their job. When the kidneys cannot do their job, life-threatening problems occur. The body cannot stay clean and strong without the help of the kidneys. In end-stage kidney disease, the kidneys cannot get better. You need a new kidney or treatments to do some of the work healthy kidneys do in order to stay alive. °CAUSES  °End-stage kidney disease usually occurs when a long-lasting (chronic) kidney disease gets worse. It may also occur after the kidneys are suddenly damaged (acute kidney injury).  °SYMPTOMS  °· Swelling (edema) of the legs, ankles, or feet.   °· Tiredness (lethargy).   °· Nausea or vomiting.   °· Confusion.   °· Problems with urination, such as:   °¨ Decreased urine production.   °¨ Frequent urination, especially at night.   °¨ Frequent accidents in children who are potty trained.   °· Muscle twitches and cramps.   °· Persistent itchiness.   °· Loss of appetite.   °· Headaches.   °· Abnormally dark or light skin.   °· Numbness in the hands or feet.   °· Easy bruising.   °· Frequent hiccups.   °· Menstruation stops. °DIAGNOSIS  °Your health care provider will measure your blood pressure and take some tests. These may include:  °· Urine tests.   °· Blood tests.   °· Imaging tests, such as:   °¨ An ultrasound exam.   °¨ Computed tomography (CT). °· A kidney biopsy. °TREATMENT  °There are two treatments for end-stage kidney disease:  °· A procedure that removes toxic wastes from the body (dialysis).   °· Receiving a new kidney  (kidney transplant). °Both of these treatments have serious risks and consequences. Your health care provider will help you determine which treatment is best for you based on your health, age, and other factors. In addition to having dialysis or a kidney transplant, you may need to take medicines to control high blood pressure (hypertension) and cholesterol and to decrease phosphorus levels in your blood.  °HOME CARE INSTRUCTIONS °· Follow your prescribed diet.   °· Take medicines only as directed by your health care provider.   °· Do not take any new medicines (prescription, over-the-counter, or nutritional supplements) unless approved by your health care provider. Many medicines can worsen your kidney damage or need to have the dose adjusted.   °· Keep all follow-up visits as directed by your health care provider. °MAKE SURE YOU: °· Understand these instructions. °· Will watch your condition. °· Will get help right away if you are not doing well or get worse. °Document Released: 12/12/2003 Document Revised: 02/05/2014 Document Reviewed: 05/20/2012 °ExitCare® Patient Information ©2015 [employer]. This information is not intended to replace advice given to you by your health care provider. Make sure you discuss any questions you have with your health care provider. ° °

## 2014-11-19 NOTE — Progress Notes (Signed)
CARE MANAGEMENT NOTE 11/19/2014  Patient:  Alexander [name],Alexander [name]   Account Number:  [id number]  Date Initiated:  11/15/2014  Documentation initiated by:  Johny ShockOYAL,CHERYL  Subjective/Objective Assessment:   CM following for progression and d/c planning.     Action/Plan:   HD unit secretary , Darel HongJudy working with this pt on outpatient HD center placement. Unfortunately this pt has demonstrated total noncompliance since his short time in [place].   Anticipated DC Date:  11/17/2014   Anticipated DC Plan:  HOME/SELF CARE      DC Planning Services  CM consult      Choice offered to / List presented to:             Status of service:  Completed, signed off Medicare Important Message given?   (If response is "NO", the following Medicare IM given date fields will be blank) Date Medicare IM given:   Medicare IM given by:   Date Additional Medicare IM given:   Additional Medicare IM given by:    Discharge Disposition:  HOME/SELF CARE  Per UR Regulation:    If discussed at Long Length of Stay Meetings, dates discussed:    Comments:  11/19/2014 1200 NCM spoke to pt and he plans to move to [place]. Pt has dialysis Center in [place] that was not aware of his plans to move. Hospital Dialysis Coordinator attempted arrangement with dialysis [facility], but center cannot confirm they will accept. [facility] in [place] denied due to noncompliance in past. Pt aware that he is medically ready for dc and will need to plan to continue his dialysis treatments in [place] until they can arrange his transfer to new Dialysis Center near [place]. Pt verbalized understanding and states he can go back to [place] to live with his mother until he has adequately transitioned to [place]. NCM explained he will need to follow up with [facility] if he wanted to see about Section 8 or low income housing. Isidoro DonningAlesia Taijon Vink RN CCM Case Mgmt phone [phone]

## 2014-11-19 NOTE — Progress Notes (Signed)
[facility] Progress Note  Assessment/Plan: 1. 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 ESRD - on Hd since age 29 - moved to [place] from [place] without arranging outpt HD - CLIP pending; discussed need for compliance and attendance.  If he can stay away from drugs, I think he will probably do ok.  Has a pregnant girlfriend who is with him and he wants to be a better father since his father was absent in his life. Given high volume gains, I suspect he would benefit by 4 hour treatments 3. Anemia - Hgb 8s Aranesp 60, repeleting Fe 4. Secondary hyperparathyroidism - on sensipar and renvela 5. HTN/volume - BP improving with meds and decreased volume;Large IDWG  4 L 2/12 and 2.65 2/15 - next HD Tuesday; still 5 kg above outpt EDW -though I don't know if he ever achieved this. Apparently signed off early last night complaining of cramping with only 2.6 off -  6. Nutrition - Lab 2.9 renal diet 7. Polysubstance abuse - hx snorting cocaine and smoking cannabis - prior incarceration for sale/use with [facility], got out in 2011 - hasn't used since he moved to [place]; + smoker; discussed cost financial plus adverse effects on his health and others (pending newborn)   Sheffield Slider, PA-C  [facility] Beeper [phone] 11/19/2014,9:01 AM  LOS: 4 days    Patient seen and examined, agree with above note with above modifications. Non compliant HD patient who moved here without HD arranged.   Issues with HD- I do not think his EDW is as low as 62.5.  Planning for next HD on Tuesday with 4 liters off in 4 hours- told him that running 4 hours will help.  He has moved here to make a better life for him and his baby on the way so has some motivation- getting his hgb up may help as well  Annie Sable, MD 11/19/2014     Subjective:   Had HD last pm, sitting with two trays, doesn't like eggs, plenty of liquids, but says he's not drinking them all. Girlfriend staying with him in the room.  Wants a SW to help him with transportation to dialysis (yet to be accepted) and housing (told him at best he would get on a waiting list for section 8 housing bur probably needs to go to DSS for this.  Objective Filed Vitals:   11/19/14 0230 11/19/14 0300 11/19/14 0316 11/19/14 0335  BP: 161/89 152/96 124/76 140/93  Pulse: 92 88 90 101  Temp:    98 F (36.7 C)  TempSrc:    Oral  Resp: Height:      Weight:   67.3 kg (148 lb 5.9 oz)   SpO2:       Physical Exam General: still with some facial edema, NAD Heart: RRR Lungs: no rales Abdomen: soft NT Extremities: tr LE edema; left upper 1 + greatly improved Dialysis Access: left upper AVGG +bruit  Dialysis Orders: 3.5h F180 400/800 62.5kg 2/2.25 Bath LUA AVG (Accuseal) Heparin 4600  Additional Objective Labs: Basic Metabolic Panel:  Recent Labs Lab 11/13/14 1714  11/16/14 1539 11/17/14 1229 11/19/14 0138  NA  --   < > 135 134* 135  K  --   < > 5.7* 4.4 4.5  CL  --   < >  99 98 98  CO2  --   < > 17* 20 25  GLUCOSE  --   < > 82 144* 126*  BUN  --   < > 53* 46* 37*  CREATININE 14.48*  < > 11.68* 9.81* 8.97*  CALCIUM  --   < > 8.4 8.5 8.0*  PHOS 6.5*  --  7.3*  --  6.3*  < > = values in this interval not displayed. Liver Function Tests:  Recent Labs Lab 11/13/14 1016 11/16/14 1539 11/17/14 1229 11/19/14 0138  AST 25  --  23  --   ALT 19  --  18  --   ALKPHOS 1120*  --  959*  --   BILITOT 0.8  --  0.8  --   PROT 7.3  --  6.4  --   ALBUMIN 3.6 3.1* 3.0* 2.9*   CBC:  Recent Labs Lab 11/14/14 1259 11/15/14 0500 11/16/14 1539 11/17/14 1229 11/19/14 0138  WBC 3.5* 4.1 4.9 4.4 6.0  HGB 8.5* 8.3* 8.4* 8.3* 8.4*  HCT 26.2* 26.2* 26.2* 25.6* 26.1*  MCV 93.9 93.6 94.6 94.5 96.3  PLT 121*  120* 113* 121* 130*  Medications:   . carvedilol  12.5 mg Oral BID WC  . cinacalcet  60 mg Oral Q supper  . darbepoetin (ARANESP) injection - DIALYSIS  60 mcg Intravenous Q Thu-HD  . enoxaparin (LOVENOX) injection  30 mg Subcutaneous Q24H  . famotidine  20 mg Oral Daily  . ferric gluconate (FERRLECIT/NULECIT) IV  125 mg Intravenous Q T,Th,Sa-HD  . gabapentin  300 mg Oral QHS  . hydrALAZINE  25 mg Oral BID  . isosorbide dinitrate  40 mg Oral BID  . metoCLOPramide  5 mg Oral TID AC & HS  . multivitamin  1 tablet Oral QHS  . nicotine  21 mg Transdermal Daily  . sevelamer carbonate  800 mg Oral TID WC

## 2014-11-19 NOTE — Progress Notes (Signed)
Patient Discharge: Disposition:   Patient discharged home. Education:  Patient  Was in a hurry, didn't give me a chance to go over discharge instructions. Transportation: patient transported in w/c. Belongings: patient took all his belongings to home.

## 2014-11-19 NOTE — Progress Notes (Signed)
Shift Event: Pt w/ c/o's of sob, abdominal distension.  Record reviewed, pt with HD scheduled 11/19/14. Called Nephrology, Dr.  Arlean HoppingSchertz, who stated will take pt to dialysis before the morning.    Illa LevelSahar Kayshaun [facility]

## 2014-11-19 NOTE — Evaluation (Signed)
Occupational Therapy Evaluation Patient Details Name: Alexander [name] MRN: [id number] DOB: 08/02/1986 Today's Date: 11/19/2014    History of Present Illness Admitted with swelling, SOB after missing a considerable amount of HD   Clinical Impression   Pt able to perform ADL and ADL transfers at a modified independent level.  Recommend a tub seat due to chronic LE pain and difficulty with prolonged standing.  No further OT needs.    Follow Up Recommendations  No OT follow up    Equipment Recommendations  Tub/shower seat    Recommendations for Other Services       Precautions / Restrictions Precautions Precautions: None Restrictions Weight Bearing Restrictions: No      Mobility Bed Mobility                  Transfers Overall transfer level: Modified independent Equipment used: Rolling walker (2 wheeled)             General transfer comment: slow moving, and painful, but independent    Balance                                            ADL Overall ADL's : Modified independent                                     Functional mobility during ADLs: Modified independent;Rolling walker General ADL Comments: Pt with difficulty standing for prolonged periods.  Will benefit from shower seat.     Vision     Perception     Praxis      Pertinent Vitals/Pain Pain Assessment: Faces Faces Pain Scale: Hurts even more Pain Location: LEs Pain Intervention(s): Monitored during session;Repositioned     Hand Dominance Right   Extremity/Trunk Assessment Upper Extremity Assessment Upper Extremity Assessment: Overall WFL for tasks assessed   Lower Extremity Assessment Lower Extremity Assessment: Defer to PT evaluation   Cervical / Trunk Assessment Cervical / Trunk Assessment: Normal   Communication Communication Communication: Expressive difficulties (face swollen, somewhat difficulty to understand at times)   Cognition  Arousal/Alertness: Awake/alert Behavior During Therapy: WFL for tasks assessed/performed Overall Cognitive Status: Within Functional Limits for tasks assessed                     General Comments       Exercises       Shoulder Instructions      Home Living Family/patient expects to be discharged to:: Private residence Living Arrangements: Spouse/significant other (girlfriend) Available Help at Discharge: Friend(s);Available 24 hours/day Type of Home: Apartment Home Access: Stairs to enter Enter[employer] CorporationEntrance Stairs-Number of Steps: 2   Home Layout: One level     Bathroom Shower/Tub: Chief Strategy OfficerTub/shower unit   Bathroom Toilet: Standard     Home Equipment: None   Additional Comments: [place] "get [place] better life"      Prior Functioning/Environment Level of Independence: Independent             OT Diagnosis:     OT Problem List:     OT Treatment/Interventions:      OT Goals(Current goals can be found in the care plan section) Acute Rehab OT Goals Patient Stated Goal: Wants to turn his life around  OT Frequency:     Barriers  to D/C:            Co-evaluation              End of Session    Activity Tolerance: Patient tolerated treatment well Patient left: in chair;with call bell/phone within reach;with family/visitor present   Time: 1610-9604 OT Time Calculation (min): 19 min Charges:  OT General Charges $OT Visit: 1 Procedure OT Evaluation $Initial OT Evaluation Tier I: 1 Procedure G-Codes:    Evern Bio 11/19/2014, 8:55 AM  [phone]

## 2014-11-19 NOTE — Progress Notes (Addendum)
Patient ID: Claiborne RiggRichard Bostrom, male   DOB: 03/25/1986, 29 y.o.   MRN: [id number]  [facility] PROGRESS NOTE  Claiborne RiggRichard Okey ZHY:[id number] DOB: 09/25/1986 DOA: 11/14/2014 PCP: No PCP Per Patient   Brief narrative:    29 y.o. male with childhood renal disease on HD for prolonged period of time, [place] and moved to [place] with his brother and has not had a chance to establish with a dialysis center here. He presented to ED with dyspnea and diffuse swelling 11/13/2014 and underwent quick HD, d/c home with an outpatient follow up but has come back 2/10 due to worsening swelling and dyspnea at rest and with exertion. After being evaluated by nephrology, was recommended to come in for formal full dialysis. Lab work notable for hyperkalemia and hyperphosphatemia without any EKG changes. Vital signs were stable.  Assessment/Plan:    ESRD on HD - AVF with excess bleeding and pain during HD prob d/t increased pressures from central restenosis - plan for HD in Tuesday, work in progress to set up in an outpatient setting  - [facility]om[facility] with multiple overlapping stents s/p intervention 2/12 arm and facial edema improving though stll some; expect this will be an ongoing issue, has multiple collat  Secondary hyperparathyroidism  - continue sensipar and renvela  HTN - continue Coreg, Hydralazine, Isordil  - may need to increase the dosage of either for better blood pressure control   Anemia of chronic renal disease  - no signs of active bleeding - continue aransep and ferric gluconate IV, given with HD  Thrombocytopenia - Plt counts improving   Hyperkalemia - K is now WNL   Hyperglycemia hemoglobin A1c-5.3 continue to monitor CBGs  History of polysubstance abuse - consultation on cessation provided and pt verbalizing understanding   DVT prophylaxis - Lovenox SQ  Code Status: Full.  Family Communication:   plan of care discussed with the patient Disposition Plan: Home when set up for outpatient HD   IV access:  Left upper AVG - + bruits   Procedures and diagnostic studies:     Dg Chest 2 View 11/14/2014 Persistent right basilar infiltrate.     Ir Pta Venous Left  11/16/2014 Tandem moderately severe stenoses in stented segments of the left subclavian and innominate veins, with good response to 14 mm balloon angioplasty. 2. 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 No associated stenosis.  ACCESS: Remains approachable for percutaneous intervention as needed.   Dg Chest Port 1 View  11/13/2014 Cardiomegaly with slight pulmonary vascular congestion. 2. Slight diffuse haziness in the right lung which could represent pneumonitis or pulmonary edema.     Medical Consultants:  Nephrology Vascular surgery   Other Consultants:  None   IAnti-Infectives:   None   Debbora PrestoMAGICK-Briceida Rasberry, MD  [facility] Pager [phone]  If 7PM-7AM, please contact night-coverage [email or web address] Password [email or web address] 11/19/2014, 1:58 PM   LOS: 4 days   HPI/Subjective: No events overnight.   Objective: Filed Vitals:   11/19/14 0300 11/19/14 0316 11/19/14 0335 11/19/14 1000  BP: 152/96 124/76 140/93 154/98  Pulse: 88 90 101 95  Temp:   98 F (36.7 C) 97.6 F (36.4 C)  TempSrc:   Oral Oral  Resp: 28 24 26 24   Height:      Weight:  67.3 kg (148 lb 5.9 oz)    SpO2:    94%    Intake/Output Summary (Last 24 hours) at 11/19/14 1358  Last data filed at 11/19/14 0900  Gross per 24 hour  Intake    240 ml  Output   2654 ml  Net  -2414 ml    Exam:   General:  Pt is alert, follows commands appropriately, not in acute distress  Cardiovascular: Regular rate and rhythm, no rubs, no gallops  Respiratory: Clear to auscultation bilaterally, no wheezing, no crackles, no rhonchi  Abdomen: Soft, non tender, non distended, bowel sounds present, no guarding  Extremities: Trace bilateral LE pitting edema, pulses  DP and PT palpable bilaterally  Neuro: Grossly nonfocal  Data Reviewed: Basic Metabolic Panel:  Recent Labs Lab 11/13/14 1016  11/13/14 1714 11/14/14 1259 11/15/14 0500 11/16/14 1539 11/17/14 1229 11/19/14 0138  NA  --   < >  --  139 137 135 134* 135  K  --   < >  --  5.0 4.7 5.7* 4.4 4.5  CL  --   < >  --  99 97 99 98 98  CO2  --   --   --  20 24 17* 20 25  GLUCOSE  --   < >  --  90 109* 82 144* 126*  BUN  --   < >  --  88* 42* 53* 46* 37*  CREATININE  --   < > 14.48* 16.42* 10.40* 11.68* 9.81* 8.97*  CALCIUM  --   --   --  7.9* 7.9* 8.4 8.5 8.0*  MG 2.3  --  2.0  --   --   --   --   --   PHOS 9.1*  --  6.5*  --   --  7.3*  --  6.3*  < > = values in this interval not displayed. Liver Function Tests:  Recent Labs Lab 11/13/14 1016 11/16/14 1539 11/17/14 1229 11/19/14 0138  AST 25  --  23  --   ALT 19  --  18  --   ALKPHOS 1120*  --  959*  --   BILITOT 0.8  --  0.8  --   PROT 7.3  --  6.4  --   ALBUMIN 3.6 3.1* 3.0* 2.9*   CBC:  Recent Labs Lab 11/14/14 1259 11/15/14 0500 11/16/14 1539 11/17/14 1229 11/19/14 0138  WBC 3.5* 4.1 4.9 4.4 6.0  HGB 8.5* 8.3* 8.4* 8.3* 8.4*  HCT 26.2* 26.2* 26.2* 25.6* 26.1*  MCV 93.9 93.6 94.6 94.5 96.3  PLT 121* 120* 113* 121* 130*    Recent Results (from the past 240 hour(s))  MRSA PCR Screening     Status: None   Collection Time: 11/15/14  2:15 AM  Result Value Ref Range Status   MRSA by PCR NEGATIVE NEGATIVE Final    Comment:        The GeneXpert MRSA Assay (FDA approved for NASAL specimens only), is one component of a comprehensive MRSA colonization surveillance program. It is not intended to diagnose MRSA infection nor to guide or monitor treatment for MRSA infections.      Scheduled Meds: . carvedilol  12.5 mg Oral BID WC  . cinacalcet  60 mg Oral Q supper  . darbepoetin   60 mcg Intravenous Q Thu-HD  . enoxaparin  injection  30 mg Subcutaneous Q24H  . famotidine  20 mg Oral Daily  . ferric gluconate   IV  125 mg Intravenous Q T,Th,Sa-HD  . gabapentin  300 mg Oral QHS  . hydrALAZINE  25 mg Oral BID  . isosorbide dinitrate  40  mg Oral BID  . metoCLOPramide  5 mg Oral TID AC & HS  . multivitamin  1 tablet Oral QHS  . nicotine  21 mg Transdermal Daily  . sevelamer carbonate  800 mg Oral TID WC   Continuous Infusions:

## 2014-11-19 NOTE — Progress Notes (Signed)
Pt went from sleeping to cramping and jumping out of bed and screaming for the nurse to take him off his HD Tx. Tx was stopped 1hr early AMA. After pt was rinsed back and disconnected ftom lines pt began demanding for the nurse to reconnect his lines and give him fluid for cramping. Line reconnected and pt given 200cc of fluid. Lines disconnected and pt again demanded for lines to be reconnected and for the nurse to give him the remaining 500cc of NS that was left in the bag. Explained to pt that I would not give him 500cc of fluid as he just had a Tx for being in resp distress from fluid overload. Pt began yelling at the nurse that he new he should not have come here because we were just trying to kill him and we should had never attempted to pull so much fluid off of him as no one else would have done this to him. Dr. Arlean HoppingSchertz called and informed of pt signing off AMA, of his mood swings( being appreciative and polite one minute then yelling the next). Of his irregular sleep pattern that was observed and pt jdemanding more NS. Additional 300cc of NS were given to the pt and he was transported back to his room.

## 2014-11-22 DIAGNOSIS — N189 Chronic kidney disease, unspecified: Secondary | ICD-10-CM | POA: Insufficient documentation

## 2014-11-22 LAB — DRUG SCREEN PANEL (SERUM)

## 2014-11-23 NOTE — Discharge Summary (Signed)
Patient left AMA 11/13/14 before he was reevaluated.  Alexander [name], Energy East CorporationLANDO

## 2016-09-19 ENCOUNTER — Emergency Department (HOSPITAL_COMMUNITY): Payer: Medicaid Other

## 2016-09-19 ENCOUNTER — Inpatient Hospital Stay (HOSPITAL_COMMUNITY)
Admission: EM | Admit: 2016-09-19 | Discharge: 2016-09-21 | DRG: 302 | Discharge status: Against Medical Advice | Payer: Medicaid Other | Attending: Internal Medicine | Admitting: Internal Medicine

## 2016-09-19 ENCOUNTER — Inpatient Hospital Stay (HOSPITAL_COMMUNITY): Payer: Medicaid Other

## 2016-09-19 ENCOUNTER — Encounter (HOSPITAL_COMMUNITY): Payer: Self-pay | Admitting: Emergency Medicine

## 2016-09-19 DIAGNOSIS — N186 End stage renal disease: Secondary | ICD-10-CM | POA: Diagnosis present

## 2016-09-19 DIAGNOSIS — J810 Acute pulmonary edema: Secondary | ICD-10-CM | POA: Diagnosis not present

## 2016-09-19 DIAGNOSIS — Z992 Dependence on renal dialysis: Secondary | ICD-10-CM

## 2016-09-19 DIAGNOSIS — I200 Unstable angina: Secondary | ICD-10-CM | POA: Diagnosis not present

## 2016-09-19 DIAGNOSIS — K529 Noninfective gastroenteritis and colitis, unspecified: Secondary | ICD-10-CM | POA: Diagnosis present

## 2016-09-19 DIAGNOSIS — I25110 Atherosclerotic heart disease of native coronary artery with unstable angina pectoris: Principal | ICD-10-CM | POA: Diagnosis present

## 2016-09-19 DIAGNOSIS — I871 Compression of vein: Secondary | ICD-10-CM | POA: Diagnosis present

## 2016-09-19 DIAGNOSIS — Z720 Tobacco use: Secondary | ICD-10-CM | POA: Diagnosis not present

## 2016-09-19 DIAGNOSIS — I5022 Chronic systolic (congestive) heart failure: Secondary | ICD-10-CM | POA: Diagnosis present

## 2016-09-19 DIAGNOSIS — K219 Gastro-esophageal reflux disease without esophagitis: Secondary | ICD-10-CM | POA: Diagnosis present

## 2016-09-19 DIAGNOSIS — G4733 Obstructive sleep apnea (adult) (pediatric): Secondary | ICD-10-CM | POA: Diagnosis present

## 2016-09-19 DIAGNOSIS — R079 Chest pain, unspecified: Secondary | ICD-10-CM | POA: Diagnosis present

## 2016-09-19 DIAGNOSIS — N2581 Secondary hyperparathyroidism of renal origin: Secondary | ICD-10-CM | POA: Diagnosis present

## 2016-09-19 DIAGNOSIS — D638 Anemia in other chronic diseases classified elsewhere: Secondary | ICD-10-CM | POA: Diagnosis present

## 2016-09-19 DIAGNOSIS — I252 Old myocardial infarction: Secondary | ICD-10-CM

## 2016-09-19 DIAGNOSIS — R112 Nausea with vomiting, unspecified: Secondary | ICD-10-CM

## 2016-09-19 DIAGNOSIS — R748 Abnormal levels of other serum enzymes: Secondary | ICD-10-CM | POA: Diagnosis not present

## 2016-09-19 DIAGNOSIS — Z9115 Patient's noncompliance with renal dialysis: Secondary | ICD-10-CM

## 2016-09-19 DIAGNOSIS — J189 Pneumonia, unspecified organism: Secondary | ICD-10-CM | POA: Diagnosis present

## 2016-09-19 DIAGNOSIS — F17200 Nicotine dependence, unspecified, uncomplicated: Secondary | ICD-10-CM | POA: Diagnosis present

## 2016-09-19 DIAGNOSIS — Z8249 Family history of ischemic heart disease and other diseases of the circulatory system: Secondary | ICD-10-CM

## 2016-09-19 DIAGNOSIS — I214 Non-ST elevation (NSTEMI) myocardial infarction: Secondary | ICD-10-CM | POA: Insufficient documentation

## 2016-09-19 DIAGNOSIS — Z79899 Other long term (current) drug therapy: Secondary | ICD-10-CM

## 2016-09-19 DIAGNOSIS — I059 Rheumatic mitral valve disease, unspecified: Secondary | ICD-10-CM | POA: Diagnosis present

## 2016-09-19 DIAGNOSIS — Z833 Family history of diabetes mellitus: Secondary | ICD-10-CM

## 2016-09-19 DIAGNOSIS — D696 Thrombocytopenia, unspecified: Secondary | ICD-10-CM | POA: Diagnosis present

## 2016-09-19 DIAGNOSIS — Z9989 Dependence on other enabling machines and devices: Secondary | ICD-10-CM

## 2016-09-19 DIAGNOSIS — Z9114 Patient's other noncompliance with medication regimen: Secondary | ICD-10-CM | POA: Diagnosis not present

## 2016-09-19 DIAGNOSIS — R1110 Vomiting, unspecified: Secondary | ICD-10-CM

## 2016-09-19 DIAGNOSIS — I132 Hypertensive heart and chronic kidney disease with heart failure and with stage 5 chronic kidney disease, or end stage renal disease: Secondary | ICD-10-CM | POA: Diagnosis present

## 2016-09-19 DIAGNOSIS — F1910 Other psychoactive substance abuse, uncomplicated: Secondary | ICD-10-CM

## 2016-09-19 DIAGNOSIS — R197 Diarrhea, unspecified: Secondary | ICD-10-CM

## 2016-09-19 LAB — COMPREHENSIVE METABOLIC PANEL
ALT: 10 U/L — AB (ref 17–63)
AST: 21 U/L (ref 15–41)
Albumin: 4.1 g/dL (ref 3.5–5.0)
Alkaline Phosphatase: 1585 U/L — ABNORMAL HIGH (ref 38–126)
Anion gap: 21 — ABNORMAL HIGH (ref 5–15)
BUN: 30 mg/dL — AB (ref 6–20)
CO2: 25 mmol/L (ref 22–32)
CREATININE: 7.22 mg/dL — AB (ref 0.61–1.24)
Calcium: 9.7 mg/dL (ref 8.9–10.3)
Chloride: 91 mmol/L — ABNORMAL LOW (ref 101–111)
GFR calc Af Amer: 10 mL/min — ABNORMAL LOW (ref >60)
GFR, EST NON AFRICAN AMERICAN: 9 mL/min — AB (ref >60)
GLUCOSE: 103 mg/dL — AB (ref 65–99)
POTASSIUM: 3.8 mmol/L (ref 3.5–5.1)
SODIUM: 137 mmol/L (ref 135–145)
Total Bilirubin: 1.1 mg/dL (ref 0.3–1.2)
Total Protein: 8.5 g/dL — ABNORMAL HIGH (ref 6.5–8.1)

## 2016-09-19 LAB — CBC
HEMATOCRIT: 33.3 % — AB (ref 39.0–52.0)
HEMATOCRIT: 27.8 % — AB (ref 39.0–52.0)
Hemoglobin: 10.5 g/dL — ABNORMAL LOW (ref 13.0–17.0)
Hemoglobin: 8.7 g/dL — ABNORMAL LOW (ref 13.0–17.0)
MCH: 30.1 pg (ref 26.0–34.0)
MCH: 29.6 pg (ref 26.0–34.0)
MCHC: 31.5 g/dL (ref 30.0–36.0)
MCHC: 31.3 g/dL (ref 30.0–36.0)
MCV: 95.4 fL (ref 78.0–100.0)
MCV: 94.6 fL (ref 78.0–100.0)
PLATELETS: 155 10*3/uL (ref 150–400)
PLATELETS: 127 10*3/uL — AB (ref 150–400)
RBC: 3.49 MIL/uL — ABNORMAL LOW (ref 4.22–5.81)
RBC: 2.94 MIL/uL — ABNORMAL LOW (ref 4.22–5.81)
RDW: 16.1 % — ABNORMAL HIGH (ref 11.5–15.5)
RDW: 16 % — AB (ref 11.5–15.5)
WBC: 6.5 10*3/uL (ref 4.0–10.5)
WBC: 3.6 10*3/uL — AB (ref 4.0–10.5)

## 2016-09-19 LAB — I-STAT CHEM 8, ED
BUN: 34 mg/dL — AB (ref 6–20)
Calcium, Ion: 0.98 mmol/L — ABNORMAL LOW (ref 1.15–1.40)
Chloride: 96 mmol/L — ABNORMAL LOW (ref 101–111)
Creatinine, Ser: 7.4 mg/dL — ABNORMAL HIGH (ref 0.61–1.24)
Glucose, Bld: 103 mg/dL — ABNORMAL HIGH (ref 65–99)
HCT: 34 % — ABNORMAL LOW (ref 39.0–52.0)
Hemoglobin: 11.6 g/dL — ABNORMAL LOW (ref 13.0–17.0)
POTASSIUM: 3.8 mmol/L (ref 3.5–5.1)
Sodium: 137 mmol/L (ref 135–145)
TCO2: 31 mmol/L (ref 0–100)

## 2016-09-19 LAB — CREATININE, SERUM
CREATININE: 3.51 mg/dL — AB (ref 0.61–1.24)
GFR calc non Af Amer: 21 mL/min — ABNORMAL LOW (ref >60)
GFR, EST AFRICAN AMERICAN: 25 mL/min — AB (ref >60)

## 2016-09-19 LAB — I-STAT TROPONIN, ED: Troponin i, poc: 1.03 ng/mL (ref 0.00–0.08)

## 2016-09-19 LAB — HEPARIN LEVEL (UNFRACTIONATED)

## 2016-09-19 LAB — TROPONIN I
TROPONIN I: 0.94 ng/mL — AB (ref <0.03)
Troponin I: 0.8 ng/mL (ref <0.03)

## 2016-09-19 LAB — MRSA PCR SCREENING: MRSA by PCR: NEGATIVE

## 2016-09-19 LAB — I-STAT CG4 LACTIC ACID, ED: LACTIC ACID, VENOUS: 2.21 mmol/L — AB (ref 0.5–1.9)

## 2016-09-19 LAB — PHOSPHORUS: Phosphorus: 3.3 mg/dL (ref 2.5–4.6)

## 2016-09-19 MED ORDER — HEPARIN SODIUM (PORCINE) 1000 UNIT/ML DIALYSIS: 20.0000 [IU]/kg | INTRAMUSCULAR | Status: DC | PRN

## 2016-09-19 MED ORDER — LIDOCAINE-PRILOCAINE 2.5-2.5 % EX CREA
1.0000 "application " | TOPICAL_CREAM | CUTANEOUS | Status: DC | PRN
  Filled 2016-09-19: qty 5

## 2016-09-19 MED ORDER — DIPHENHYDRAMINE HCL 50 MG/ML IJ SOLN
25.0000 mg | Freq: Three times a day (TID) | INTRAMUSCULAR | Status: DC | PRN
  Administered 2016-09-19 – 2016-09-21 (×6): 25 mg via INTRAVENOUS
  Filled 2016-09-19 (×6): qty 1

## 2016-09-19 MED ORDER — HEPARIN SODIUM (PORCINE) 1000 UNIT/ML DIALYSIS: 1000.0000 [IU] | INTRAMUSCULAR | Status: DC | PRN

## 2016-09-19 MED ORDER — ISOSORBIDE DINITRATE 10 MG PO TABS
40.0000 mg | ORAL_TABLET | Freq: Two times a day (BID) | ORAL | Status: DC
  Administered 2016-09-19 – 2016-09-21 (×3): 40 mg via ORAL
  Filled 2016-09-19 (×4): qty 4

## 2016-09-19 MED ORDER — MORPHINE SULFATE (PF) 4 MG/ML IV SOLN
INTRAVENOUS | Status: AC
  Administered 2016-09-19: 4 mg via INTRAVENOUS
  Filled 2016-09-19: qty 1

## 2016-09-19 MED ORDER — SODIUM CHLORIDE 0.9 % IV SOLN: 100.0000 mL | INTRAVENOUS | Status: DC | PRN

## 2016-09-19 MED ORDER — HEPARIN BOLUS VIA INFUSION
2000.0000 [IU] | Freq: Once | INTRAVENOUS | Status: AC
  Administered 2016-09-19: 2000 [IU] via INTRAVENOUS
  Filled 2016-09-19: qty 2000

## 2016-09-19 MED ORDER — SODIUM CHLORIDE 0.9% FLUSH
3.0000 mL | Freq: Two times a day (BID) | INTRAVENOUS | Status: DC
  Administered 2016-09-19 – 2016-09-21 (×4): 3 mL via INTRAVENOUS

## 2016-09-19 MED ORDER — NIFEDIPINE ER 60 MG PO TB24
60.0000 mg | ORAL_TABLET | Freq: Every day | ORAL | Status: DC
  Administered 2016-09-19 – 2016-09-20 (×2): 60 mg via ORAL
  Filled 2016-09-19 (×3): qty 1

## 2016-09-19 MED ORDER — ALTEPLASE 2 MG IJ SOLR: 2.0000 mg | Freq: Once | INTRAMUSCULAR | Status: DC | PRN

## 2016-09-19 MED ORDER — ASPIRIN 81 MG PO CHEW
324.0000 mg | CHEWABLE_TABLET | Freq: Once | ORAL | Status: AC
  Administered 2016-09-19: 324 mg via ORAL
  Filled 2016-09-19: qty 4

## 2016-09-19 MED ORDER — PENTAFLUOROPROP-TETRAFLUOROETH EX AERO: 1.0000 "application " | INHALATION_SPRAY | CUTANEOUS | Status: DC | PRN

## 2016-09-19 MED ORDER — HYDRALAZINE HCL 50 MG PO TABS
50.0000 mg | ORAL_TABLET | Freq: Two times a day (BID) | ORAL | Status: DC
  Administered 2016-09-19 – 2016-09-20 (×2): 50 mg via ORAL
  Filled 2016-09-19 (×4): qty 1

## 2016-09-19 MED ORDER — SEVELAMER CARBONATE 800 MG PO TABS
800.0000 mg | ORAL_TABLET | Freq: Three times a day (TID) | ORAL | Status: DC
  Administered 2016-09-19 – 2016-09-21 (×5): 800 mg via ORAL
  Filled 2016-09-19 (×4): qty 1

## 2016-09-19 MED ORDER — MORPHINE SULFATE (PF) 4 MG/ML IV SOLN
4.0000 mg | INTRAVENOUS | Status: DC | PRN
  Administered 2016-09-19 – 2016-09-20 (×7): 4 mg via INTRAVENOUS
  Filled 2016-09-19 (×7): qty 1

## 2016-09-19 MED ORDER — NITROGLYCERIN 0.4 MG SL SUBL
0.4000 mg | SUBLINGUAL_TABLET | SUBLINGUAL | Status: DC | PRN
  Administered 2016-09-19 (×2): 0.4 mg via SUBLINGUAL
  Filled 2016-09-19: qty 1

## 2016-09-19 MED ORDER — DIPHENHYDRAMINE HCL 50 MG/ML IJ SOLN
25.0000 mg | Freq: Once | INTRAMUSCULAR | Status: AC
  Administered 2016-09-19 (×2): 25 mg via INTRAVENOUS
  Filled 2016-09-19: qty 1

## 2016-09-19 MED ORDER — ONDANSETRON HCL 4 MG PO TABS: 4.0000 mg | ORAL_TABLET | Freq: Four times a day (QID) | ORAL | Status: DC | PRN

## 2016-09-19 MED ORDER — HYDROMORPHONE HCL 2 MG/ML IJ SOLN
0.5000 mg | Freq: Once | INTRAMUSCULAR | Status: AC
  Administered 2016-09-19: 0.5 mg via INTRAVENOUS
  Filled 2016-09-19: qty 1

## 2016-09-19 MED ORDER — HEPARIN (PORCINE) IN NACL 100-0.45 UNIT/ML-% IJ SOLN
750.0000 [IU]/h | INTRAMUSCULAR | Status: DC
  Administered 2016-09-19: 750 [IU]/h via INTRAVENOUS
  Filled 2016-09-19: qty 250

## 2016-09-19 MED ORDER — FAMOTIDINE 20 MG PO TABS
20.0000 mg | ORAL_TABLET | Freq: Every day | ORAL | Status: DC
  Administered 2016-09-20 – 2016-09-21 (×2): 20 mg via ORAL
  Filled 2016-09-19 (×3): qty 1

## 2016-09-19 MED ORDER — DIPHENHYDRAMINE HCL 50 MG/ML IJ SOLN
INTRAMUSCULAR | Status: AC
  Administered 2016-09-19: 25 mg via INTRAVENOUS
  Filled 2016-09-19: qty 1

## 2016-09-19 MED ORDER — ASPIRIN EC 81 MG PO TBEC
81.0000 mg | DELAYED_RELEASE_TABLET | Freq: Every day | ORAL | Status: DC
  Administered 2016-09-19 – 2016-09-21 (×3): 81 mg via ORAL
  Filled 2016-09-19 (×3): qty 1

## 2016-09-19 MED ORDER — DIPHENHYDRAMINE HCL 50 MG/ML IJ SOLN
25.0000 mg | Freq: Once | INTRAMUSCULAR | Status: AC
  Administered 2016-09-19: 25 mg via INTRAVENOUS
  Filled 2016-09-19: qty 1

## 2016-09-19 MED ORDER — CARVEDILOL 25 MG PO TABS
25.0000 mg | ORAL_TABLET | Freq: Two times a day (BID) | ORAL | Status: DC
  Administered 2016-09-19 – 2016-09-21 (×4): 25 mg via ORAL
  Filled 2016-09-19 (×4): qty 1

## 2016-09-19 MED ORDER — LIDOCAINE HCL (PF) 1 % IJ SOLN: 5.0000 mL | INTRAMUSCULAR | Status: DC | PRN

## 2016-09-19 MED ORDER — DIPHENHYDRAMINE HCL 25 MG PO CAPS
25.0000 mg | ORAL_CAPSULE | Freq: Once | ORAL | Status: DC
  Filled 2016-09-19: qty 1

## 2016-09-19 MED ORDER — CINACALCET HCL 30 MG PO TABS
60.0000 mg | ORAL_TABLET | Freq: Every day | ORAL | Status: DC
  Administered 2016-09-19 – 2016-09-20 (×2): 60 mg via ORAL
  Filled 2016-09-19 (×3): qty 2

## 2016-09-19 MED ORDER — ONDANSETRON HCL 4 MG/2ML IJ SOLN
4.0000 mg | Freq: Four times a day (QID) | INTRAMUSCULAR | Status: DC | PRN
  Filled 2016-09-19: qty 2

## 2016-09-19 MED ORDER — ASPIRIN 81 MG PO CHEW: 324.0000 mg | CHEWABLE_TABLET | Freq: Once | ORAL | Status: DC

## 2016-09-19 MED ORDER — MORPHINE SULFATE (PF) 4 MG/ML IV SOLN
4.0000 mg | Freq: Once | INTRAVENOUS | Status: AC
  Administered 2016-09-19: 4 mg via INTRAVENOUS
  Filled 2016-09-19: qty 1

## 2016-09-19 MED ORDER — NICOTINE 14 MG/24HR TD PT24
14.0000 mg | MEDICATED_PATCH | Freq: Every day | TRANSDERMAL | Status: DC
  Administered 2016-09-19 – 2016-09-20 (×2): 14 mg via TRANSDERMAL
  Filled 2016-09-19 (×3): qty 1

## 2016-09-19 NOTE — ED Notes (Signed)
Multiple RNs at bedside attempting PIV access

## 2016-09-19 NOTE — ED Provider Notes (Signed)
[facility]-EMERGENCY DEPT Provider Note   CSN: [id number] Arrival date & time: 09/19/16  0400     History   Chief Complaint Chief Complaint  Patient presents with  . needs dialysis  . Chest Pain    HPI Alexander [name] is a 30 y.o. male.  Patient presents with a three-day history of left-sided chest pain that has been constant. Associated with shortness of breath. Patient reports nausea and vomiting throughout the day today. States his illness started 2 days ago during dialysis. He normally goes Tuesday, Thursday and Saturday. 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 Patient reports he had a heart attack on December 2 and underwent cardiac catheter[facility]. He was told he had blockages but no intervention was performed. He reports his left-sided chest pain is constant and does not radiate. It is worse with exertion. He thinks he is fluid overloaded. He denies any recent antibiotic use or fever. He denies any abdominal pain. He's had several episodes of loose stools and several episodes of vomiting today and states he cannot keep anything down.   The history is provided by the patient.  Chest Pain   Associated symptoms include abdominal pain, nausea, shortness of breath, vomiting and weakness. Pertinent negatives include no dizziness, no fever and no headaches.    Past Medical History:  Diagnosis Date  . ESRD on hemodialysis 11/13/2014   Per patient had childhood kidney disease, not sure etiology.  Around age 30 was started on RRT with PD for a short time, then changed to HD which he says he has been doing for about 16 years.  Has a LUA AVF as of Feb 2016.     Marland Kitchen. Hypertension     Patient Active Problem List   Diagnosis Date Noted  . Chronic renal failure   . Dialysis AV fistula malfunction (HCC)   . Left arm swelling   . Volume overload 11/14/2014  . ESRD (end stage renal disease) on dialysis (HCC) 11/14/2014  . Hypertension  11/14/2014  . Tobacco abuse 11/14/2014  . Chronic pain of left upper extremity 11/14/2014  . ESRD on hemodialysis (HCC) 11/13/2014  . Noncompliance with renal dialysis (HCC) 11/13/2014  . Hyperkalemia 11/13/2014  . Anemia 11/13/2014  . Hyponatremia 11/13/2014    History reviewed. No pertinent surgical history.     Home Medications    Prior to Admission medications   Medication Sig Start Date End Date Taking? Authorizing Provider  carvedilol (COREG) 25 MG tablet Take 25 mg by mouth 2 (two) times daily with a meal.    Historical Provider, MD  cinacalcet (SENSIPAR) 60 MG tablet Take 60 mg by mouth daily.    Historical Provider, MD  famotidine (PEPCID) 20 MG tablet Take 20 mg by mouth daily.     Historical Provider, MD  hydrALAZINE (APRESOLINE) 50 MG tablet Take 50 mg by mouth 2 (two) times daily.    Historical Provider, MD  isosorbide dinitrate (ISORDIL) 40 MG tablet Take 40 mg by mouth 2 (two) times daily.    Historical Provider, MD  metoCLOPramide (REGLAN) 5 MG tablet Take 5 mg by mouth 4 (four) times daily -  before meals and at bedtime.    Historical Provider, MD  NIFEdipine (PROCARDIA XL/ADALAT-CC) 60 MG 24 hr tablet Take 60 mg by mouth daily.    Historical Provider, MD  sevelamer (RENAGEL) 800 MG tablet Take 800 mg by mouth 3 (three) times daily with meals.  Historical Provider, MD    Family History No family history on file.  Social History Social History  Substance Use Topics  . Smoking status: Current Every Day Smoker    Packs/day: 1.00    Years: 5.00    Types: Cigarettes  . Smokeless tobacco: Never Used  . Alcohol use No     Allergies   Patient has no known allergies.   Review of Systems Review of Systems  Constitutional: Positive for activity change and appetite change. Negative for fatigue and fever.  HENT: Negative for congestion.   Respiratory: Positive for chest tightness and shortness of breath.   Cardiovascular: Positive for chest pain.    Gastrointestinal: Positive for abdominal pain, diarrhea, nausea and vomiting.  Genitourinary: Negative for dysuria and hematuria.  Musculoskeletal: Negative for arthralgias and myalgias.  Neurological: Positive for weakness. Negative for dizziness, light-headedness and headaches.  A complete 10 system review of systems was obtained and all systems are negative except as noted in the HPI and PMH.     Physical Exam Updated Vital Signs BP 148/90   Pulse 104   Temp 98 F (36.7 C) (Oral)   Resp 18   SpO2 99%   Physical Exam  Constitutional: He is oriented to person, place, and time. He appears well-developed and well-nourished. No distress.  HENT:  Head: Normocephalic and atraumatic.  Mouth/Throat: Oropharynx is clear and moist. No oropharyngeal exudate.  Eyes: Conjunctivae and EOM are normal. Pupils are equal, round, and reactive to light.  Neck: Normal range of motion. Neck supple. JVD present.  No meningismus.  Cardiovascular: Normal rate, regular rhythm, normal heart sounds and intact distal pulses.   No murmur heard. Pulmonary/Chest: Effort normal and breath sounds normal. No respiratory distress. He exhibits no tenderness.  Basilar crackles  Abdominal: Soft. There is no tenderness. There is no rebound and no guarding.  Musculoskeletal: Normal range of motion. He exhibits no edema or tenderness.  LUE AV graft with thrill  Neurological: He is alert and oriented to person, place, and time. No cranial nerve deficit. He exhibits normal muscle tone. Coordination normal.  No ataxia on finger to nose bilaterally. No pronator drift. 5/5 strength throughout. CN 2-12 intact.Equal grip strength. Sensation intact.   Skin: Skin is warm.  Psychiatric: He has a normal mood and affect. His behavior is normal.  Nursing note and vitals reviewed.    ED Treatments / Results  Labs (all labs ordered are listed, but only abnormal results are displayed) Labs Reviewed  COMPREHENSIVE METABOLIC  PANEL - Abnormal; Notable for the following:       Result Value   Chloride 91 (*)    Glucose, Bld 103 (*)    BUN 30 (*)    Creatinine, Ser 7.22 (*)    Total Protein 8.5 (*)    ALT 10 (*)    Alkaline Phosphatase 1,585 (*)    GFR calc non Af Amer 9 (*)    GFR calc Af Amer 10 (*)    Anion gap 21 (*)    All other components within normal limits  CBC - Abnormal; Notable for the following:    RBC 3.49 (*)    Hemoglobin 10.5 (*)    HCT 33.3 (*)    RDW 16.1 (*)    All other components within normal limits  I-STAT TROPOININ, ED - Abnormal; Notable for the following:    Troponin i, poc 1.03 (*)    All other components within normal limits  I-STAT CHEM 8, ED -  Abnormal; Notable for the following:    Chloride 96 (*)    BUN 34 (*)    Creatinine, Ser 7.40 (*)    Glucose, Bld 103 (*)    Calcium, Ion 0.98 (*)    Hemoglobin 11.6 (*)    HCT 34.0 (*)    All other components within normal limits  I-STAT CG4 LACTIC ACID, ED - Abnormal; Notable for the following:    Lactic Acid, Venous 2.21 (*)    All other components within normal limits  C DIFFICILE QUICK SCREEN W PCR REFLEX  HEPARIN LEVEL (UNFRACTIONATED)  CBC  CREATININE, SERUM  TROPONIN I  TROPONIN I  TROPONIN I  RAPID URINE DRUG SCREEN, HOSP PERFORMED  I-STAT ARTERIAL BLOOD GAS, ED  I-STAT CG4 LACTIC ACID, ED    EKG  EKG Interpretation  Date/Time:  Saturday September 19 2016 04:58:50 EST Ventricular Rate:  110 PR Interval:  166 QRS Duration: 112 QT Interval:  363 QTC Calculation: 492 R Axis:   104 Text Interpretation:  Sinus tachycardia Biatrial enlargement Borderline intraventricular conduction delay Borderline T abnormalities, inferior leads ST elevation, consider anterior injury Prolonged QT interval probable LVH, ST elevation v3, v4, ST depression v5, v6 d/w Dr. Katrinka Blazing Confirmed by Manus Gunning  MD, River Mckercher [phone]) on 09/19/2016 5:33:33 AM       Radiology Dg Chest Portable 1 View  Result Date: 09/19/2016 CLINICAL DATA:   Shortness of breath and diaphoresis for a few hours. History CHF, end-stage renal disease on dialysis. EXAM: PORTABLE CHEST 1 VIEW COMPARISON:  Chest radiograph November 14, 2014 FINDINGS: The cardiac silhouette is moderate to severely enlarged, unchanged. Bilateral diffuse alveolar airspace opacities. No pleural effusion. No pneumothorax. LEFT subclavian stent. 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 IMPRESSION: Diffuse alveolar airspace opacities in background of cardiomegaly most compatible with pulmonary edema though, infection could have a similar appearance. Electronically Signed   By: Awilda Metro M.D.   On: 09/19/2016 05:31    Procedures Procedures (including critical care time)  Medications Ordered in ED Medications  morphine 4 MG/ML injection 4 mg (not administered)  diphenhydrAMINE (BENADRYL) injection 25 mg (not administered)  aspirin chewable tablet 324 mg (324 mg Oral Given 09/19/16 0504)     Initial Impression / Assessment and Plan / ED Course  I have reviewed the triage vital signs and the nursing notes.  Pertinent labs & imaging results that were available during my care of the patient were reviewed by me and considered in my medical decision making (see chart for details).  Clinical Course   Dialysis patient with 3 days of constant chest pain, vomiting, nausea, diarrhea. EKG shows LVH with ST elevation in V3 and V4, ST depression in V5 and C6. This was reviewed with cardiology Dr. Tiburcio Pea as well as  Dr. Katrinka Blazing. Dr. Katrinka Blazing feels this is not consistent with STEMI. He recommends medical management and obtaining records from [facility].  Patient's troponin is 1.0. Potassium is normal. CXR with pulmonary edema. No resp distress. O2 saturation stable on nasal cannula.   Need for dialysis d/w Dr Marisue Humble who will see.  Chest pain persists despite morphine and NTG. Denies abdominal pain. No vomiting or diarrhea in the ED.  CT  A/p  pending.  Plan admission to hospitalist service. Nephrology and cardiology to consult.  Records from [facility] pending. Nothing available in care everywhere.  Heparin gtt for NSTEMI and unstable angina.  CRITICAL CARE Performed by: Glynn Octave Total critical care time: 60 minutes Critical care time  was exclusive of separately billable procedures and treating other patients. Critical care was necessary to treat or prevent imminent or life-threatening deterioration. Critical care was time spent personally by me on the following activities: development of treatment plan with patient and/or surrogate as well as nursing, discussions with consultants, evaluation of patient's response to treatment, examination of patient, obtaining history from patient or surrogate, ordering and performing treatments and interventions, ordering and review of laboratory studies, ordering and review of radiographic studies, pulse oximetry and re-evaluation of patient's condition.  Final Clinical Impressions(s) / ED Diagnoses   Final diagnoses:  NSTEMI (non-ST elevated myocardial infarction) (HCC)  Acute pulmonary edema (HCC)  Vomiting and diarrhea    New Prescriptions New Prescriptions   No medications on file     Glynn Octave, MD 09/19/16 [phone]

## 2016-09-19 NOTE — Progress Notes (Addendum)
During shift report patient asking to leave unit to go walk around hospital.Expained to patient that he needed order to leave nursing unit but he could walk in halls.Patient stated," That's not good enough.I need to leave now." This nurse asked patient to give her few minutes to get in touch with on call MD to see if he would be allowed to leave nursing unit.Patient stated ,"okay."

## 2016-09-19 NOTE — ED Notes (Signed)
Placed pt on 2 liters of oxygen nasal cannula due to pr complaining of shortness of breath

## 2016-09-19 NOTE — Progress Notes (Signed)
Patient arrived to unit per bed.  Reviewed treatment plan and this RN agrees.  Report received from bedside RN, Reynald.  Consent obtained.  Patient A & o X 4. Lung sounds clear, diminished to ausculation in all fields. No edema. Cardiac: NSR.  Prepped LUAVG with alcohol and cannulated with two 15 gauge needles.  Pulsation of blood noted.  Flushed access well with saline per protocol.  Connected and secured lines and initiated tx at 1347.  UF goal of 500 mL and net fluid removal of 0 mL.  Will continue to monitor.

## 2016-09-19 NOTE — H&P (Signed)
History and Physical    Alexander [name] ZOX:[id number] DOB: 12/23/1985 DOA: 09/19/2016  PCP: No PCP Per Patient  Patient coming from: home  Chief Complaint: severe chest pain, nausea vomiting, diarrhea  HPI: Alexander [name] is a 30 y.o. male with medical history significant of ESRD (on HD on Tue-Thur-Sat), secondary hyperparathyroidism, obstructive sleep apnea on CPAP, HTN, anemia, thrombocytopenia, polysubstance abuse who presented to the emergency department with complaints of severe, squeezing, left sided chest pain. Onset of pain last night, it was increasing in intensity, without radiation, accompanied by dyspnea. Patient also reported nausea and vomiting, multiple episodes of diarrhea without abdominal pain. He had a hemodialysis  on Thursday (09/17/2016), but was unable to tolerate the full session due to nausea and weakness. Patient reported that on 09/05/2016 he suferred heart attack and [place] [facility], but no intervention was performed. Emergency room is working on obtaining records of the procedure He denied indigestion, cough, fever or chills. Patient had been unable to eat the last 48 hours his severe nausea and vomiting. Also complained of severe skin dryness and itching He admits to daily marijuana use to help reduce pain and reports smoking 6-10 cigarettes a day  ED Course: In the emergency department his vital signs were stable, blood work showed mild anemia with hemoglobin 10.5 g/dL, creatinine 7 (20 to lactic acid of 2.21, mildly elevated protein at 8.5 and mildly reduced ALT 10.0 His EKG showed ST depression in the inferolateral leads II, V5-V6, Troponin was elevated to 1.03 and previous Troponin in September 2016 was 0.09 Chest x-ray showed diffuse alveolar airspace opacities in the background of cardiomegaly probably most associated with pulmonary edema versus infection  Review of Systems: As per HPI otherwise 10 point review of systems negative.    Past Medical History:  Diagnosis Date  . ESRD on hemodialysis 11/13/2014   Per patient had childhood kidney disease, not sure etiology.  Around age 30 was started on RRT with PD for a short time, then changed to HD which he says he has been doing for about 16 years.  Has a LUA AVF as of Feb 2016.     Marland Kitchen. Hypertension     No Known Allergies   Family history: reviewed and not pertienet  Social history:  Smokes tobacco, denied alcohol, uses majyana daily, lives with his brother   Prior to Admission medications   Medication Sig Start Date End Date Taking? Authorizing Provider  carvedilol (COREG) 25 MG tablet Take 25 mg by mouth 2 (two) times daily with a meal.   Yes Historical Provider, MD  cinacalcet (SENSIPAR) 60 MG tablet Take 60 mg by mouth daily.   Yes Historical Provider, MD  famotidine (PEPCID) 20 MG tablet Take 20 mg by mouth daily.    Yes Historical Provider, MD  hydrALAZINE (APRESOLINE) 50 MG tablet Take 50 mg by mouth 2 (two) times daily.   Yes Historical Provider, MD  isosorbide dinitrate (ISORDIL) 40 MG tablet Take 40 mg by mouth 2 (two) times daily.   Yes Historical Provider, MD  metoCLOPramide (REGLAN) 5 MG tablet Take 5 mg by mouth 4 (four) times daily -  before meals and at bedtime.   Yes Historical Provider, MD  NIFEdipine (PROCARDIA XL/ADALAT-CC) 60 MG 24 hr tablet Take 60 mg by mouth daily.   Yes Historical Provider, MD  sevelamer (RENAGEL) 800 MG tablet Take 800 mg by mouth 3 (three) times daily with meals.   Yes Historical Provider, MD    Physical  Exam: Vitals:   09/19/16 0632 09/19/16 0700 09/19/16 0730 09/19/16 0800  BP: 128/75 117/70 117/83 120/76  Pulse: 99 103 102 97  Resp: 25  (!) 27 19  Temp:      TempSrc:      SpO2: 96% 95% 90% 98%      Constitutional: NAD, calm, comfortable Vitals:   09/19/16 0632 09/19/16 0700 09/19/16 0730 09/19/16 0800  BP: 128/75 117/70 117/83 120/76  Pulse: 99 103 102 97  Resp: 25  (!) 27 19  Temp:      TempSrc:       SpO2: 96% 95% 90% 98%   Eyes: PERRL, lids and conjunctivae normal ENMT: Mucous membranes are dry. Posterior pharynx clear of any exudate or lesions.Normal dentition.  Neck: normal, supple, no masses, no thyromegaly Respiratory: bibasilar rales half way up to auscultation bilaterally, no wheezing, no crackles. Normal respiratory effort. No accessory muscle use.  Cardiovascular: Regular rate and rhythm, no murmurs / rubs / gallops. No extremity edema. 2+ pedal pulses. No carotid bruits. Positive JVD at 45 degrees angle Abdomen: positive tenderness and guarding, no masses palpated. No hepatosplenomegaly. Bowel sounds positive.  Musculoskeletal: no clubbing / cyanosis. No joint deformity upper and lower extremities. Good ROM, no contractures. Normal muscle tone.  Skin: dry, no rashes, lesions, ulcers. No induration Neurologic: CN 2-12 grossly intact. Sensation intact, DTR normal. Strength 5/5 in all 4.  Psychiatric: Normal judgment and insight. Alert and oriented x 3. Normal mood.    Labs on Admission: I have personally reviewed following labs and imaging studies  CBC:  Recent Labs Lab 09/19/16 0428 09/19/16 0451  WBC 6.5  --   HGB 10.5* 11.6*  HCT 33.3* 34.0*  MCV 95.4  --   PLT 155  --    Basic Metabolic Panel:  Recent Labs Lab 09/19/16 0428 09/19/16 0451  NA 137 137  K 3.8 3.8  CL 91* 96*  CO2 25  --   GLUCOSE 103* 103*  BUN 30* 34*  CREATININE 7.22* 7.40*  CALCIUM 9.7  --    GFR: CrCl cannot be calculated (Unknown ideal weight.). Liver Function Tests:  Recent Labs Lab 09/19/16 0428  AST 21  ALT 10*  ALKPHOS 1,585*  BILITOT 1.1  PROT 8.5*  ALBUMIN 4.1    Radiological Exams on Admission: Dg Chest Portable 1 View  Result Date: 09/19/2016 CLINICAL DATA:  Shortness of breath and diaphoresis for a few hours. History CHF, end-stage renal disease on dialysis. EXAM: PORTABLE CHEST 1 VIEW COMPARISON:  Chest radiograph November 14, 2014 FINDINGS: The cardiac  silhouette is moderate to severely enlarged, unchanged. Bilateral diffuse alveolar airspace opacities. No pleural effusion. No pneumothorax. LEFT subclavian stent. 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 IMPRESSION: Diffuse alveolar airspace opacities in background of cardiomegaly most compatible with pulmonary edema though, infection could have a similar appearance. Electronically Signed   By: Awilda Metroourtnay  Bloomer M.D.   On: 09/19/2016 05:31    EKG: Independently reviewed. SR, ST depression in Lead II, V5-V6  Assessment/Plan Principal Problem:   Chest pain Active Problems:   ESRD on hemodialysis (HCC)   Tobacco abuse   Nausea and vomiting   Diarrhea   Substance abuse   OSA on CPAP   Chest pain - doubt acute coronary syndrome Continue cycling enzymes to determine the trend, cardiology recommended echocardiogram, which I have ordered to rule out pericarditis Continue telemetry monitoring, supplemental oxygen if needed, nitroglycerin paste, aspirin, heparin, beta blocker and morphine IV when  necessary Suspect fluid volume overload by clinical presentation and chest x-ray - awaits for a hemodialysis  Nausea vomiting - potential marijuana hyperemesis syndrome Check urine drug screen to r/o out other substance abuse Also has elevated alkaline phosphatase-greater than 1500 and abdominal CT ordered  Diarrhea Abdominal CT is pending, C. difficile PCR was ordered He has elevated  ESRD on HD Renal service was notified by ED physician about patient's admission to the hospital and need for HD session.  He is a patient of Dr. Vear Clock in [place], [place]  Obstructive sleep apnea - continue home CPAP  Polysubstance abuse - as mentioned above, patient is a smoker and he uses marijuana. We will order a NicoDerm patch and await for urine drug screen    DVT prophylaxis: Heparin drip  Code Status: Full  Family Communication: None  Disposition Plan: To  be determined  Consults called: Nephrology and cardiology contacted  Admission status: Inpatient  Raymon Mutton MD [facility] Pager [phone]  If 7PM-7AM, please contact night-coverage [email or web address] Password [facility]  09/19/2016, 8:49 AM

## 2016-09-19 NOTE — ED Notes (Signed)
RN attempted PIV x 2 unsucessful

## 2016-09-19 NOTE — Consult Note (Signed)
Interventional Cardiology  Phone call and opinion request from Dr. Wyvonnia Dusky about this patient.  30 yo with dialysis dependent ESRD, nausea, diarrhea, vomiting, continuing for 3 days.  Unable to complete dialysis yesterday because of symptoms.  ECG show sinus tach, LVH, and repolarization abnormality. No STEMI criteria are present.  Trop POC is 1.  Alk phosphatase 1585 with otherwise normal liver panel. Etiology is unclear, R/O Paget's Disease, R/O Primary biliary cirrhosis.  Recommend cycling Troponin I and monitoring cardiac condition. Get dialysis completed. Notify us if troponin is increasing rather remaining flat. 2 D echo to r/o pericardial effusion.

## 2016-09-19 NOTE — ED Notes (Signed)
Pt asking for benadryl, when given 25 oral capsule pt refuses and says it will not work.

## 2016-09-19 NOTE — Progress Notes (Signed)
Pt refusing ABG, RT informed MD and RN

## 2016-09-19 NOTE — Progress Notes (Signed)
Upon arrival back to nursing unit [name] told this nurse that she wheeled patient outside and he smoked 1 cigarette and then threw the rest of his pack away.Patient now asking for nicotine patch.

## 2016-09-19 NOTE — Progress Notes (Signed)
Pt placed on CPAP of 10 and FIO2 at 35%. With a full face mack. Pt tol well at this time will monotor

## 2016-09-19 NOTE — ED Triage Notes (Addendum)
Pt now reports L sided chest pain with sob, nausea, vomiting, and diarrhea that started yesterday while at dialysis.

## 2016-09-19 NOTE — ED Triage Notes (Signed)
Pt states he needs dialysis.  States he [place] dialysis yesterday (in [place]) but couldn't finish it because he felt sick.

## 2016-09-19 NOTE — Progress Notes (Signed)
Attempted to get report from ER. RN unable to give report at this time.

## 2016-09-19 NOTE — Progress Notes (Signed)
Spoke with Dr.Kakrankandy about patient leaving nursing unit to walk around.Per MD patient could leave unit one time tonight with staff member for 10 minutes.Expalined this to patient.

## 2016-09-19 NOTE — Progress Notes (Signed)
Patient asking for medication for anxiety none ordered.Spoke with Dr. Toniann FailKakrakandy no new orders received.

## 2016-09-19 NOTE — Procedures (Signed)
  I was present at this dialysis session, have reviewed the session itself and made  appropriate changes Vinson Moselleob Lawana Hartzell MD [facility] pager [phone]   09/19/2016, 3:11 PM

## 2016-09-19 NOTE — Progress Notes (Signed)
ANTICOAGULATION CONSULT NOTE - Initial Consult  Pharmacy Consult for heparin Indication: chest pain/ACS  No Known Allergies  Patient Measurements:   Heparin Dosing Weight: 65kg  Vital Signs: Temp: 98 F (36.7 C) (12/16 0405) Temp Source: Oral (12/16 0405) BP: 148/90 (12/16 0445) Pulse Rate: 104 (12/16 0445)  Labs:  Recent Labs  09/19/16 0428 09/19/16 0451  HGB 10.5* 11.6*  HCT 33.3* 34.0*  PLT 155  --   CREATININE 7.22* 7.40*     Medical History: Past Medical History:  Diagnosis Date  . ESRD on hemodialysis 11/13/2014   Per patient had childhood kidney disease, not sure etiology.  Around age 30 was started on RRT with PD for a short time, then changed to HD which he says he has been doing for about 16 years.  Has a LUA AVF as of Feb 2016.     Marland Kitchen. Hypertension     Assessment: 30yo male c/o CP, N/V/D, and SOB x3d, istat troponin elevated, to begin heparin.  Goal of Therapy:  Heparin level 0.3-0.7 units/ml Monitor platelets by anticoagulation protocol: Yes   Plan:  Will give heparin 2000 units IV bolus x1 followed by gtt at 750 units/hr and monitor heparin levels and CBC.  Vernard GamblesVeronda Sophee Mckimmy, PharmD, BCPS  09/19/2016,5:55 AM

## 2016-09-19 NOTE — Progress Notes (Signed)
Pt set up on CPAP in HD per order and pt's request.  Pt uses CPAP of 10 at home.  Pt is tolerating well. 35% FIO2 bleed in.  RT will continue to monitor.

## 2016-09-19 NOTE — Consult Note (Signed)
Alexander [name] Admit Date: 09/19/2016 09/19/2016 Alexander MissSANFORD, Alexander [name] Requesting Physician:  Rancour MD  Reason for Consult:  ESRD, Angina, Pulmonary Edema HPI:  30M ESRD Alexander [name] THS presented to ED with CP and SOB.  Has known severe CAD and recent eval with [facility] where PCI with atherectomy was deferred 2/2 compliance issues.  [facility] and has not been compliant at local HD unit.  Says had HD 12/14 with s/o early.    PMH Incudes:  ESRD  Hx/o SVC Syndrome  HTN  CAD, severe, followed at [facility] but no recent PCI given compliance issues  Substance Use -- daily MJ  HTN  GERD  pCXR suggestive of edema.  BP WNL. On RA +/- 2L.  Afebrile   CEs with positive troponin.  States currenlty has chest pain, some diasrrhe and CT A/P ordered.  Cardiology to eval.    Outpt HD Orders Unit: [facility] Alexander [name] Days: THS Time: 4h Dialyzer: F180 EDW: 59kg K/Ca: 2K/3Ca Access: LUE AVG Needle Size: 15g  BFR/DFR: 400 / 800 UF Proflie: none VDRA: Zemplar 3mcg qTx EPO: Epogen 20K qTx Heparin: 4600 IU IVB  Creatinine, Ser (mg/dL)  Date Value  40/98/119112/16/2017 7.40 (H)  09/19/2016 7.22 (H)  11/19/2014 8.97 (H)  11/17/2014 9.81 (H)  11/16/2014 11.68 (H)  11/15/2014 10.40 (H)  11/14/2014 16.42 (H)  11/13/2014 14.48 (H)  11/13/2014 15.50 (H)  11/13/2014 20.97 (H)  ] I/Os:  ROS Balance of 12 systems is negative w/ exceptions as above  PMH  Past Medical History:  Diagnosis Date  . ESRD on hemodialysis 11/13/2014   Per patient had childhood kidney disease, not sure etiology.  Around age 30 was started on RRT with PD for a short time, then changed to HD which he says he has been doing for about 16 years.  Has a LUA AVF as of Feb 2016.     Marland Kitchen. Hypertension    PSH History reviewed. No pertinent surgical history. FH No family history on file. SH  reports that he has been smoking Cigarettes.  He has a 2.50 pack-year smoking history. He has never used smokeless tobacco. He  reports that he uses drugs, including Marijuana. He reports that he does not drink alcohol. Allergies No Known Allergies Home medications Prior to Admission medications   Medication Sig Start Date End Date Taking? Authorizing Provider  carvedilol (COREG) 25 MG tablet Take 25 mg by mouth 2 (two) times daily with a meal.   Yes Historical Provider, MD  cinacalcet (SENSIPAR) 60 MG tablet Take 60 mg by mouth daily.   Yes Historical Provider, MD  famotidine (PEPCID) 20 MG tablet Take 20 mg by mouth daily.    Yes Historical Provider, MD  hydrALAZINE (APRESOLINE) 50 MG tablet Take 50 mg by mouth 2 (two) times daily.   Yes Historical Provider, MD  isosorbide dinitrate (ISORDIL) 40 MG tablet Take 40 mg by mouth 2 (two) times daily.   Yes Historical Provider, MD  metoCLOPramide (REGLAN) 5 MG tablet Take 5 mg by mouth 4 (four) times daily -  before meals and at bedtime.   Yes Historical Provider, MD  NIFEdipine (PROCARDIA XL/ADALAT-CC) 60 MG 24 hr tablet Take 60 mg by mouth daily.   Yes Historical Provider, MD  sevelamer (RENAGEL) 800 MG tablet Take 800 mg by mouth 3 (three) times daily with meals.   Yes Historical Provider, MD    Current Medications Scheduled Meds: . diphenhydrAMINE  25 mg Oral Once  . nicotine  14  mg Transdermal Daily   Continuous Infusions: . heparin 750 Units/hr (09/19/16 0625)   PRN Meds:.diphenhydrAMINE, morphine injection, nitroGLYCERIN  CBC  Recent Labs Lab 09/19/16 0428 09/19/16 0451  WBC 6.5  --   HGB 10.5* 11.6*  HCT 33.3* 34.0*  MCV 95.4  --   PLT 155  --    Basic Metabolic Panel  Recent Labs Lab 09/19/16 0428 09/19/16 0451  NA 137 137  K 3.8 3.8  CL 91* 96*  CO2 25  --   GLUCOSE 103* 103*  BUN 30* 34*  CREATININE 7.22* 7.40*  CALCIUM 9.7  --     Physical Exam  Blood pressure 120/76, pulse 97, temperature 98 F (36.7 C), temperature source Oral, resp. rate 19, SpO2 98 %. GEN: NAd, lying flat, nl wob ENT: NCAT EYES: EOMI CV: RRR, no rub PULM:  CTAB ABD: s/nt/nd SKIN: no rashes/lesions EXT:No LEE LUE AVG + [name]/T   Assessment 30M ESRD chronic severe CAD admit with  CP and SOB.    1. ESRD THS Alexander [name] LUE AVG 2. Severe CAD, admit with angina, +TnI 3. Pulmonary Edema 4. Noncompliance with HD 5. Tobacco and Marijuana user 6. Anemia, stable 7. 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 HTN, BP stable currently  Plan 1. HD today, outpt Rx, attempt EDW 2. Will follow along   Alexander Heckyan Lillianah Swartzentruber MD [phone] pgr 09/19/2016, 9:18 AM

## 2016-09-19 NOTE — Progress Notes (Signed)
Patient left nursing unit with NT Christina in wheelchair.

## 2016-09-19 NOTE — ED Notes (Signed)
Attempted IV x3 (two by ultrasound in R upper arm and one in posterior forearm). Unsuccessful.

## 2016-09-19 NOTE — Progress Notes (Signed)
Dialysis treatment completed.  1000 mL ultrafiltrated and net fluid removal 500 mL.    Patient tearful. Lung sounds diminshed to ausculation in all fields. Generalized edema. Cardiac: NSR.  Disconnected lines and removed needles.  Pressure held for 10 minutes and band aid/gauze dressing applied.  Report given to bedside RN, Reynald.     During final 10 minutes of treatment patient attempted to order food.  Had issues d/t epic placement.  Patient told this RN that "room service got an attitude."  This RN phoned bedside RN, Reynald, querying about policy.  Patient transferred in epic and food ordered to be picked up by bedside RN, Reynald.  Patient noted to be tearful after this, not speaking when this RN attempted to assess emotional state.  Tissues provided.  Incident reported to bedside RN, Reynald.

## 2016-09-20 ENCOUNTER — Inpatient Hospital Stay (HOSPITAL_COMMUNITY): Payer: Medicaid Other

## 2016-09-20 DIAGNOSIS — I25110 Atherosclerotic heart disease of native coronary artery with unstable angina pectoris: Principal | ICD-10-CM

## 2016-09-20 DIAGNOSIS — I871 Compression of vein: Secondary | ICD-10-CM

## 2016-09-20 LAB — COMPREHENSIVE METABOLIC PANEL
ALBUMIN: 3.3 g/dL — AB (ref 3.5–5.0)
ALK PHOS: 1421 U/L — AB (ref 38–126)
ALT: 9 U/L — AB (ref 17–63)
AST: 18 U/L (ref 15–41)
Anion gap: 19 — ABNORMAL HIGH (ref 5–15)
BILIRUBIN TOTAL: 0.9 mg/dL (ref 0.3–1.2)
BUN: 14 mg/dL (ref 6–20)
CALCIUM: 9.3 mg/dL (ref 8.9–10.3)
CO2: 25 mmol/L (ref 22–32)
CREATININE: 4.63 mg/dL — AB (ref 0.61–1.24)
Chloride: 92 mmol/L — ABNORMAL LOW (ref 101–111)
GFR calc non Af Amer: 15 mL/min — ABNORMAL LOW (ref >60)
GFR, EST AFRICAN AMERICAN: 18 mL/min — AB (ref >60)
GLUCOSE: 79 mg/dL (ref 65–99)
Potassium: 3.9 mmol/L (ref 3.5–5.1)
SODIUM: 136 mmol/L (ref 135–145)
Total Protein: 7.2 g/dL (ref 6.5–8.1)

## 2016-09-20 LAB — PTH, INTACT AND CALCIUM
Calcium, Total (PTH): 8.6 mg/dL — ABNORMAL LOW (ref 8.7–10.2)
PTH: 1452 pg/mL — AB (ref 15–65)

## 2016-09-20 LAB — CBC
HCT: 27.9 % — ABNORMAL LOW (ref 39.0–52.0)
Hemoglobin: 7.5 g/dL — ABNORMAL LOW (ref 13.0–17.0)
MCH: 26.5 pg (ref 26.0–34.0)
MCHC: 26.9 g/dL — AB (ref 30.0–36.0)
MCV: 98.6 fL (ref 78.0–100.0)
PLATELETS: 107 10*3/uL — AB (ref 150–400)
RBC: 2.83 MIL/uL — ABNORMAL LOW (ref 4.22–5.81)
RDW: 16.2 % — AB (ref 11.5–15.5)
WBC: 4 10*3/uL (ref 4.0–10.5)

## 2016-09-20 LAB — HEPARIN LEVEL (UNFRACTIONATED): Heparin Unfractionated: <0.1 IU/mL — ABNORMAL LOW (ref 0.30–0.70)

## 2016-09-20 MED ORDER — ALPRAZOLAM 0.25 MG PO TABS
0.2500 mg | ORAL_TABLET | Freq: Once | ORAL | Status: AC
  Administered 2016-09-20: 0.25 mg via ORAL
  Filled 2016-09-20: qty 1

## 2016-09-20 MED ORDER — PANTOPRAZOLE SODIUM 40 MG IV SOLR
40.0000 mg | Freq: Two times a day (BID) | INTRAVENOUS | Status: DC
  Administered 2016-09-20 (×2): 40 mg via INTRAVENOUS
  Filled 2016-09-20 (×3): qty 40

## 2016-09-20 MED ORDER — MORPHINE SULFATE (PF) 2 MG/ML IV SOLN
2.0000 mg | INTRAVENOUS | Status: DC | PRN
  Administered 2016-09-20 – 2016-09-21 (×5): 2 mg via INTRAVENOUS
  Filled 2016-09-20 (×5): qty 1

## 2016-09-20 MED ORDER — DICLOFENAC SODIUM 1 % TD GEL
2.0000 g | Freq: Four times a day (QID) | TRANSDERMAL | Status: DC
  Administered 2016-09-20 (×3): 2 g via TOPICAL
  Filled 2016-09-20: qty 100

## 2016-09-20 NOTE — Progress Notes (Signed)
Text paged [facility] MD on call regarding patient's request for Xanax for anxiety, awaiting call back, patient also is c/o nausea, will give Zofran IV as ordered and continue to monitor.

## 2016-09-20 NOTE — Progress Notes (Signed)
Admit: 09/19/2016 LOS: 1  56M ESRD chronic severe CAD admit with  CP and SOB.    Subjective:  HD yesterday:  Post weight 56.9kg, BP stable, 0.5L UF C/o arm pain today, chest pain CEs negatve/flat CT A/P with bibasliar edema vs infection (Afebrile, nl WBC)  12/16 0701 - 12/17 0700 In: 355 [P.O.:355] Out: 500   Filed Weights   09/19/16 1343 09/19/16 1747 09/20/16 0446  Weight: 57.4 kg (126 lb 8.7 oz) 56.9 kg (125 lb 7.1 oz) 57.5 kg (126 lb 11.2 oz)    Scheduled Meds: . aspirin EC  81 mg Oral Daily  . carvedilol  25 mg Oral BID WC  . cinacalcet  60 mg Oral Q breakfast  . diphenhydrAMINE  25 mg Oral Once  . famotidine  20 mg Oral Daily  . hydrALAZINE  50 mg Oral BID  . isosorbide dinitrate  40 mg Oral BID  . nicotine  14 mg Transdermal Daily  . NIFEdipine  60 mg Oral Daily  . pantoprazole (PROTONIX) IV  40 mg Intravenous Q12H  . sevelamer carbonate  800 mg Oral TID WC  . sodium chloride flush  3 mL Intravenous Q12H   Continuous Infusions: PRN Meds:.diphenhydrAMINE, morphine injection, nitroGLYCERIN, ondansetron **OR** ondansetron (ZOFRAN) IV  Current Labs: reviewed    Physical Exam:  Blood pressure (!) 100/55, pulse 79, temperature 97.5 F (36.4 C), temperature source Oral, resp. rate 18, weight 57.5 kg (126 lb 11.2 oz), SpO2 96 %. GEN: NAd, lying flat, nl wob ENT: NCAT EYES: EOMI CV: RRR, no rub PULM: CTAB ABD: s/nt/nd SKIN: no rashes/lesions EXT:No LEE LUE AVG + B/T  Outpt HD Orders Unit: [facility] Alexander [name] Days: THS Time: 4h Dialyzer: F180 EDW: 59kg K/Ca: 2K/3Ca Access: LUE AVG Needle Size: 15g  BFR/DFR: 400 / 800 UF Proflie: none VDRA: Zemplar 3mcg qTx EPO: Epogen 20K qTx Heparin: 4600 IU IVB  A 1. ESRD THS Alexander [name] LUE AVG 2. Severe CAD, admit with angina, +TnI but flat, unlikely ACS/NSTEMI; Cardiology eval'd 3. Pulmonary Edema stable 4. Noncompliance with HD 5. Tobacco and Marijuana user 6. Anemia, stable 7. 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 HTN, BP stable currently 9. 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 Next HD on Tuesday if here 2. Needs lower EDW and to attend HD 3. Encouraged compliance with HD and other medical matters   Sabra HeckRyan Valree Feild MD 09/20/2016, 9:04 AM   Recent Labs Lab 09/19/16 0428 09/19/16 0451 09/19/16 1619 09/19/16 1620 09/20/16 0356  NA 137 137  --   --  136  K 3.8 3.8  --   --  3.9  CL 91* 96*  --   --  92*  CO2 25  --   --   --  25  GLUCOSE 103* 103*  --   --  79  BUN 30* 34*  --   --  14  CREATININE 7.22* 7.40* 3.51*  --  4.63*  CALCIUM 9.7  --   --   --  9.3  PHOS  --   --   --  3.3  --     Recent Labs Lab 09/19/16 0428 09/19/16 0451 09/19/16 1608  WBC 6.5  --  3.6*  HGB 10.5* 11.6* 8.7*  HCT 33.3* 34.0* 27.8*  MCV 95.4  --  94.6  PLT 155  --  127*

## 2016-09-20 NOTE — Progress Notes (Signed)
Pulled patient a Xanax 0.25 mg tab and some Zofran for nausea but when I went back to give it to him he was sound asleep (snoring), will continue to monitor.

## 2016-09-20 NOTE — Progress Notes (Signed)
Report received in patient's room via Anne RN using SBAR format, reviewed orders, labs, VS, meds and patient's general condition, assumed care of patient. 

## 2016-09-20 NOTE — Progress Notes (Addendum)
PROGRESS NOTE    Claiborne RiggRichard Markos  ZOX:[id number] DOB: 08/15/1986 DOA: 09/19/2016  PCP: No PCP Per Patient   Brief Narrative:  30 year old male with a history of end-stage renal disease, hypertension, SVC syndrome, OSA, polysubstance abuse in the past, smoker who apparently quit last month severe coronary artery disease who presents for chest pain. He is a poor historian and records have been [facility] from where he was discharged on 12/12. Per records, he presented there on 12/9 with chest pain "similar to prior MI" unrelieved by nitroglycerin. He has known severe coronary artery disease with diffuse LAD disease, 90% mid LAD lesion, 60% mid RCA stenosis and 90% proximal posterior lateral stenosis. EF is 45%. He had an end STEMI with a troponin that peaked at 13.8. According to notes he is intermittently compliant with medication. Also according to this note, he declined further interventions however the patient tells me that this is not true. At this point he states he is willing to undergo an intervention if needed. His chest pain he states is present on the left side of his chest is squeezing and constant and he has severe shortness of breath when he tries to ambulate with exacerbation of his pain. Additional presenting complaints were nausea vomiting and diarrhea which have resolved since admission.  Subjective: See above  Assessment & Plan:   Principal Problem:   Chest pain -As mentioned above, recent  NSTEMI with history of diffuse disease-echo did not show wall motion abnormalities - we'll consult cardiology -Aspirin, Coreg, isosorbide dinitrate, morphine when necessary  Active Problems:  Chronic systolic heart failure - Echo 81/19/1411/08/22-  'EF 45-50%, LVH is mild to moderate-anterior leaflet chordal calcification-small echodense structure with independent motion which may represent a subacute or acute vegetation-clinical correlation requested, dilated left  atrium' -Fluid management with dialysis -we'll repeat echo in a.m. to reassess the "vegetation" mentioned in prior echo    ESRD on hemodialysis -Dr. Marisue [facility] aware of admission and would like to be called if patient is still here on Tuesday-  SVC syndrome -Face is swollen, speech is difficult to understand -Brachiocephalic stent?- venoplasty done by IR on 09/11/16 as mentioned in notes from [facility]-am unable to find any more information regarding this  Medical Compliance issues -[facility] mentions he is not always compliant medication -in the hospital he was demanding a regular diet rather than a renal diet- no other compliance issues noted in the hospital    Tobacco abuse - Apparently has quit    Nausea and vomiting -Uncertain cause-may be cardiac related-has resolved    Diarrhea -According to patient this has also resolved      OSA on CPAP    DVT prophylaxis: Heparin Code Status: Full code Family Communication:  Disposition Plan: Home when stable Consultants:  Cardiology Procedures:    Antimicrobials:  Anti-infectives    None       Objective: Vitals:   09/19/16 2200 09/20/16 0040 09/20/16 0446 09/20/16 0839  BP: (!) 112/52 130/78 (!) 110/56 (!) 100/55  Pulse:  90 79   Resp: 18     Temp:  98.4 F (36.9 C) 97.5 F (36.4 C)   TempSrc:  Oral Oral   SpO2:   96%   Weight:   57.5 kg (126 lb 11.2 oz)     Intake/Output Summary (Last 24 hours) at 09/20/16 1535 Last data filed at 09/20/16 1407  Gross per 24 hour  Intake  222 ml  Output              500 ml  Net             -278 ml   Filed Weights   09/19/16 1343 09/19/16 1747 09/20/16 0446  Weight: 57.4 kg (126 lb 8.7 oz) 56.9 kg (125 lb 7.1 oz) 57.5 kg (126 lb 11.2 oz)    Examination: General exam: Appears comfortable  HEENT: PERRLA, oral mucosa moist, no sclera icterus or thrush- face and tongue swollen-speech difficult to comprehend Respiratory system: Clear to  auscultation. Respiratory effort normal. Cardiovascular system: S1 & S2 heard, RRR.  No murmurs  Gastrointestinal system: Abdomen soft, non-tender, nondistended. Normal bowel sound. No organomegaly Central nervous system: Alert and oriented. No focal neurological deficits. Extremities: No cyanosis, clubbing or edema Skin: No rashes or ulcers Psychiatry:  Mood & affect appropriate.     Data Reviewed: I have personally reviewed following labs and imaging studies  CBC:  Recent Labs Lab 09/19/16 0428 09/19/16 0451 09/19/16 1608 09/20/16 0833  WBC 6.5  --  3.6* 4.0  HGB 10.5* 11.6* 8.7* 7.5*  HCT 33.3* 34.0* 27.8* 27.9*  MCV 95.4  --  94.6 98.6  PLT 155  --  127* 107*   Basic Metabolic Panel:  Recent Labs Lab 09/19/16 0428 09/19/16 0451 09/19/16 1619 09/19/16 1620 09/20/16 0356  NA 137 137  --   --  136  K 3.8 3.8  --   --  3.9  CL 91* 96*  --   --  92*  CO2 25  --   --   --  25  GLUCOSE 103* 103*  --   --  79  BUN 30* 34*  --   --  14  CREATININE 7.22* 7.40* 3.51*  --  4.63*  CALCIUM 9.7  --   --   --  9.3  PHOS  --   --   --  3.3  --    GFR: CrCl cannot be calculated (Unknown ideal weight.). Liver Function Tests:  Recent Labs Lab 09/19/16 0428 09/20/16 0356  AST 21 18  ALT 10* 9*  ALKPHOS 1,585* 1,421*  BILITOT 1.1 0.9  PROT 8.5* 7.2  ALBUMIN 4.1 3.3*   No results for input(s): LIPASE, AMYLASE in the last 168 hours. No results for input(s): AMMONIA in the last 168 hours. Coagulation Profile: No results for input(s): INR, PROTIME in the last 168 hours. Cardiac Enzymes:  Recent Labs Lab 09/19/16 1619 09/19/16 2109  TROPONINI 0.94* 0.80*   BNP (last 3 results) No results for input(s): PROBNP in the last 8760 hours. HbA1C: No results for input(s): HGBA1C in the last 72 hours. CBG: No results for input(s): GLUCAP in the last 168 hours. Lipid Profile: No results for input(s): CHOL, HDL, LDLCALC, TRIG, CHOLHDL, LDLDIRECT in the last 72  hours. Thyroid Function Tests: No results for input(s): TSH, T4TOTAL, FREET4, T3FREE, THYROIDAB in the last 72 hours. Anemia Panel: No results for input(s): VITAMINB12, FOLATE, FERRITIN, TIBC, IRON, RETICCTPCT in the last 72 hours. Urine analysis: No results found for: COLORURINE, APPEARANCEUR, LABSPEC, PHURINE, GLUCOSEU, HGBUR, BILIRUBINUR, KETONESUR, PROTEINUR, UROBILINOGEN, NITRITE, LEUKOCYTESUR Sepsis Labs: @LABRCNTIP (procalcitonin:4,lacticidven:4) ) Recent Results (from the past 240 hour(s))  MRSA PCR Screening     Status: None   Collection Time: 09/19/16  9:54 AM  Result Value Ref Range Status   MRSA by PCR NEGATIVE NEGATIVE Final    Comment:        The GeneXpert MRSA  Assay (FDA approved for NASAL specimens only), is one component of a comprehensive MRSA colonization surveillance program. It is not intended to diagnose MRSA infection nor to guide or monitor treatment for MRSA infections.          Radiology Studies: Ct Abdomen Pelvis Wo Contrast  Result Date: 09/19/2016 CLINICAL DATA:  End-stage renal disease.  Vomiting.  Diarrhea. EXAM: CT ABDOMEN AND PELVIS WITHOUT CONTRAST TECHNIQUE: Multidetector CT imaging of the abdomen and pelvis was performed following the standard protocol without IV contrast. COMPARISON:  None. FINDINGS: Lower chest: Patchy opacity and mild bullous changes at both lung bases. No pleural fluid. Hepatobiliary: Probable sludge in the gallbladder. Unremarkable liver. Pancreas: Unremarkable. No pancreatic ductal dilatation or surrounding inflammatory changes. Spleen: Normal in size without focal abnormality. Adrenals/Urinary Tract: Normal appearing adrenal glands. Very small kidneys. No significant urine in the urinary bladder. No urinary tract calculi or hydronephrosis seen. Stomach/Bowel: Poorly distended stomach with diffuse wall thickening. There is also diffuse wall thickening involving proximal small bowel loops. These are suboptimally evaluated  due to the lack of intravenous and oral contrast. The distal small bowel and colon are grossly unremarkable. Multiple appendicoliths with no evidence of appendicitis. Vascular/Lymphatic: Extensive arterial calcifications, including the abdominal aorta and and its branches as well as the coronary arteries. Enlarged heart. No enlarged lymph nodes. Reproductive: Prostate is unremarkable. Other: None. Musculoskeletal: Patchy lucency and sclerosis throughout the bony skeleton. Mild compression deformities of the L5, T11, T10 and T9 vertebral bodies. No acute fracture lines or bony retropulsion. IMPRESSION: 1. Diffuse wall thickening involving the stomach and proximal small bowel, most likely due to gastroenteritis. 2. Extensive pneumonia or pulmonary edema at both lung bases with underlying changes of COPD. 3. Marked cardiomegaly. 4. Marked diffuse atherosclerotic calcifications, including aortic atherosclerosis and coronary artery atherosclerosis. 5. Severe bilateral renal atrophy. 6. 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 Associated multiple mild vertebral compression deformities. Electronically Signed   By: Beckie Salts M.D.   On: 09/19/2016 09:27   Dg Chest Portable 1 View  Result Date: 09/19/2016 CLINICAL DATA:  Shortness of breath and diaphoresis for a few hours. History CHF, end-stage renal disease on dialysis. EXAM: PORTABLE CHEST 1 VIEW COMPARISON:  Chest radiograph November 14, 2014 FINDINGS: The cardiac silhouette is moderate to severely enlarged, unchanged. Bilateral diffuse alveolar airspace opacities. No pleural effusion. No pneumothorax. LEFT subclavian stent. 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 IMPRESSION: Diffuse alveolar airspace opacities in background of cardiomegaly most compatible with pulmonary edema though, infection could have a similar appearance. Electronically Signed   By: Awilda Metro M.D.   On:  09/19/2016 05:31      Scheduled Meds: . aspirin EC  81 mg Oral Daily  . carvedilol  25 mg Oral BID WC  . cinacalcet  60 mg Oral Q breakfast  . diclofenac sodium  2 g Topical QID  . diphenhydrAMINE  25 mg Oral Once  . famotidine  20 mg Oral Daily  . hydrALAZINE  50 mg Oral BID  . isosorbide dinitrate  40 mg Oral BID  . nicotine  14 mg Transdermal Daily  . NIFEdipine  60 mg Oral Daily  . pantoprazole (PROTONIX) IV  40 mg Intravenous Q12H  . sevelamer carbonate  800 mg Oral TID WC  . sodium chloride flush  3 mL Intravenous Q12H   Continuous Infusions:   LOS: 1 day    Time spent in minutes: 35    Malachy Coleman, MD [facility] Pager: [email or web address] Password [email or web address] 09/20/2016, 3:35 PM

## 2016-09-20 NOTE — Progress Notes (Addendum)
Spoke with Dr. Butler Denmarkizwan and new order received for regular diet.  Pt wants regular diet and is threatening to leave AMA. Pt states " That  Food is shit, I want regular food.  Can I have food from outside?" Explained to pt that we can only give food that is in line with specified ordered diet. Pt offered AMA form to sign. Will update MD.

## 2016-09-21 ENCOUNTER — Inpatient Hospital Stay (HOSPITAL_COMMUNITY): Payer: Medicaid Other

## 2016-09-21 ENCOUNTER — Other Ambulatory Visit (HOSPITAL_COMMUNITY): Payer: Medicaid Other

## 2016-09-21 DIAGNOSIS — I200 Unstable angina: Secondary | ICD-10-CM

## 2016-09-21 DIAGNOSIS — I214 Non-ST elevation (NSTEMI) myocardial infarction: Secondary | ICD-10-CM

## 2016-09-21 DIAGNOSIS — Z720 Tobacco use: Secondary | ICD-10-CM

## 2016-09-21 MED ORDER — PANTOPRAZOLE SODIUM 40 MG PO TBEC
40.0000 mg | DELAYED_RELEASE_TABLET | Freq: Two times a day (BID) | ORAL | Status: DC
  Administered 2016-09-21: 40 mg via ORAL
  Filled 2016-09-21: qty 1

## 2016-09-21 MED ORDER — AMLODIPINE BESYLATE 10 MG PO TABS
10.0000 mg | ORAL_TABLET | Freq: Every day | ORAL | Status: DC
  Administered 2016-09-21: 10 mg via ORAL
  Filled 2016-09-21: qty 1

## 2016-09-21 MED ORDER — SEVELAMER CARBONATE 800 MG PO TABS: 2400.0000 mg | ORAL_TABLET | Freq: Three times a day (TID) | ORAL | Status: DC

## 2016-09-21 MED ORDER — TICAGRELOR 90 MG PO TABS
90.0000 mg | ORAL_TABLET | Freq: Two times a day (BID) | ORAL | Status: DC
Start: 2016-09-21 — End: 2016-09-21
  Administered 2016-09-21: 90 mg via ORAL
  Filled 2016-09-21: qty 1

## 2016-09-21 MED ORDER — ISOSORBIDE MONONITRATE ER 60 MG PO TB24
120.0000 mg | ORAL_TABLET | Freq: Every day | ORAL | Status: DC
  Administered 2016-09-21: 120 mg via ORAL
  Filled 2016-09-21: qty 2

## 2016-09-21 MED ORDER — ATORVASTATIN CALCIUM 80 MG PO TABS
80.0000 mg | ORAL_TABLET | Freq: Every day | ORAL | Status: DC
  Administered 2016-09-21: 80 mg via ORAL
  Filled 2016-09-21: qty 1

## 2016-09-21 NOTE — Progress Notes (Signed)
[facility] ROUNDING NOTE   Subjective:   Interval History: Appears to be doing well with no complaints  - Pending down  1.03-->0.94-->08. He was [facility] with non-STEMI. At that time his troponin wa s peaked to 13.4. This is clearly indicates that troponin is trending down since that admission. Also per discharge summary, "patient is not interested in any further interventional procedure, recommended medical therapy".  Objective:  Vital signs in last 24 hours:  Temp:  [97.9 F (36.6 C)-98 F (36.7 C)] 98 F (36.7 C) (12/18 0753) Pulse Rate:  [71-82] 71 (12/18 0753) Resp:  [16-18] 16 (12/18 0753) BP: (94-137)/(49-79) 137/79 (12/18 1211) SpO2:  [96 %-100 %] 100 % (12/18 0753)  Weight change:  Filed Weights   09/19/16 1343 09/19/16 1747 09/20/16 0446  Weight: 57.4 kg (126 lb 8.7 oz) 56.9 kg (125 lb 7.1 oz) 57.5 kg (126 lb 11.2 oz)    Intake/Output: I/O last 3 completed shifts: In: 444 [P.O.:444] Out: -    Intake/Output this shift:  No intake/output data recorded.  CVS- RRR RS- CTA ABD- BS present soft non-distended EXT- no edema  LUE AVG    Basic Metabolic Panel:  Recent Labs Lab 09/19/16 0428 09/19/16 0451 09/19/16 1608 09/19/16 1619 09/19/16 1620 09/20/16 0356  NA 137 137  --   --   --  136  K 3.8 3.8  --   --   --  3.9  CL 91* 96*  --   --   --  92*  CO2 25  --   --   --   --  25  GLUCOSE 103* 103*  --   --   --  79  BUN 30* 34*  --   --   --  14  CREATININE 7.22* 7.40*  --  3.51*  --  4.63*  CALCIUM 9.7  --  8.6*  --   --  9.3  PHOS  --   --   --   --  3.3  --     Liver Function Tests:  Recent Labs Lab 09/19/16 0428 09/20/16 0356  AST 21 18  ALT 10* 9*  ALKPHOS 1,585* 1,421*  BILITOT 1.1 0.9  PROT 8.5* 7.2  ALBUMIN 4.1 3.3*   No results for input(s): LIPASE, AMYLASE in the last 168 hours. No results for input(s): AMMONIA in the last 168 hours.  CBC:  Recent Labs Lab 09/19/16 0428 09/19/16 0451  09/19/16 1608 09/20/16 0833  WBC 6.5  --  3.6* 4.0  HGB 10.5* 11.6* 8.7* 7.5*  HCT 33.3* 34.0* 27.8* 27.9*  MCV 95.4  --  94.6 98.6  PLT 155  --  127* 107*    Cardiac Enzymes:  Recent Labs Lab 09/19/16 1619 09/19/16 2109  TROPONINI 0.94* 0.80*    BNP: Invalid input(s): POCBNP  CBG: No results for input(s): GLUCAP in the last 168 hours.  Microbiology: Results for orders placed or performed during the hospital encounter of 09/19/16  MRSA PCR Screening     Status: None   Collection Time: 09/19/16  9:54 AM  Result Value Ref Range Status   MRSA by PCR NEGATIVE NEGATIVE Final    Comment:        The GeneXpert MRSA Assay (FDA approved for NASAL specimens only), is one component of a comprehensive MRSA colonization surveillance program. It is not intended to diagnose MRSA infection nor to guide or monitor treatment for MRSA infections.     Coagulation Studies: No  results for input(s): LABPROT, INR in the last 72 hours.  Urinalysis: No results for input(s): COLORURINE, LABSPEC, PHURINE, GLUCOSEU, HGBUR, BILIRUBINUR, KETONESUR, PROTEINUR, UROBILINOGEN, NITRITE, LEUKOCYTESUR in the last 72 hours.  Invalid input(s): APPERANCEUR    Imaging: No results found.   Medications:    . amLODipine  10 mg Oral Daily  . aspirin EC  81 mg Oral Daily  . atorvastatin  80 mg Oral q1800  . carvedilol  25 mg Oral BID WC  . cinacalcet  60 mg Oral Q breakfast  . diclofenac sodium  2 g Topical QID  . diphenhydrAMINE  25 mg Oral Once  . isosorbide mononitrate  120 mg Oral Daily  . pantoprazole  40 mg Oral BID  . sevelamer carbonate  2,400 mg Oral TID WC  . sodium chloride flush  3 mL Intravenous Q12H  . ticagrelor  90 mg Oral BID   diphenhydrAMINE, morphine injection, nitroGLYCERIN, ondansetron **OR** ondansetron (ZOFRAN) IV  Assessment/ Plan:  1. Next HD on Tuesday if here 2. Needs lower EDW and to attend HD 3. Encouraged compliance with HD and other medical matters     LOS: 2 Makilah Dowda W @TODAY @12 :46 PM

## 2016-09-21 NOTE — Discharge Summary (Signed)
Physician Discharge Summary  Alexander [name] MRN: [id number] DOB/AGE: 06/23/1986 30 y.o.  PCP: No PCP Per Patient   Admit date: 09/19/2016 Discharge date: 09/21/2016  Discharge Diagnoses:    Principal Problem:   Chest pain Active Problems:   ESRD on hemodialysis (HCC)   Tobacco abuse   Nausea and vomiting   Diarrhea   Substance abuse   OSA on CPAP   Acute pulmonary edema ([place]) Left AGAINST MEDICAL ADVICE   Patient left AGAINST MEDICAL ADVICE when we refused to prescribe him narcotic medications       Discharge Medication List as of 09/21/2016  2:49 PM    CONTINUE these medications which have NOT CHANGED   Details  ALPRAZolam (XANAX) 0.5 MG tablet Take 0.5 mg by mouth 3 (three) times daily as needed for anxiety., Historical Med    amLODipine (NORVASC) 10 MG tablet Take 10 mg by mouth daily., Historical Med    aspirin EC 81 MG tablet Take 81 mg by mouth daily., Historical Med    atorvastatin (LIPITOR) 80 MG tablet Take 80 mg by mouth daily at 6 PM., Historical Med    carvedilol (COREG) 25 MG tablet Take 25 mg by mouth 2 (two) times daily with a meal., Until Discontinued, Historical Med    cinacalcet (SENSIPAR) 60 MG tablet Take 60 mg by mouth daily., Until Discontinued, Historical Med    famotidine (PEPCID) 20 MG tablet Take 20 mg by mouth daily. , Until Discontinued, Historical Med    isosorbide mononitrate (IMDUR) 60 MG 24 hr tablet Take 120 mg by mouth daily., Historical Med    lisinopril (PRINIVIL,ZESTRIL) 20 MG tablet Take 20 mg by mouth daily., Historical Med    metoCLOPramide (REGLAN) 5 MG tablet Take 5 mg by mouth 4 (four) times daily -  before meals and at bedtime., Until Discontinued, Historical Med    oxyCODONE-acetaminophen (PERCOCET/ROXICET) 5-325 MG tablet Take 1-2 tablets by mouth every 4 (four) hours as needed for moderate pain. , Historical Med    sevelamer (RENAGEL) 800 MG tablet Take 2,400 mg by mouth 3 (three) times daily with meals. ,  Historical Med    ticagrelor (BRILINTA) 90 MG TABS tablet Take 90 mg by mouth 2 (two) times daily., Historical Med         Discharge Condition: Overall guarded   Discharge Instructions Get Medicines reviewed and adjusted: Please take all your medications with you for your next visit with your Primary MD  Please request your Primary MD to go over all hospital tests and procedure/radiological results at the follow up, please ask your Primary MD to get all Hospital records sent to his/her office.  If you experience worsening of your admission symptoms, develop shortness of breath, life threatening emergency, suicidal or homicidal thoughts you must seek medical attention immediately by calling 911 or calling your MD immediately if symptoms less severe.  You must read complete instructions/literature along with all the possible adverse reactions/side effects for all the Medicines you take and that have been prescribed to you. Take any new Medicines after you have completely understood and accpet all the possible adverse reactions/side effects.   Do not drive when taking Pain medications.   Do not take more than prescribed Pain, Sleep and Anxiety Medications  Special Instructions: If you have smoked or chewed Tobacco in the last 2 yrs please stop smoking, stop any regular Alcohol and or any Recreational drug use.  Wear Seat belts while driving.  Please note  You were cared for by  a hospitalist during your hospital stay. Once you are discharged, your primary care physician will handle any further medical issues. Please note that NO REFILLS for any discharge medications will be authorized once you are discharged, as it is imperative that you return to your primary care physician (or establish a relationship with a primary care physician if you do not have one) for your aftercare needs so that they can reassess your need for medications and monitor your lab values.     No Known  Allergies    Disposition: 07-Left Against Medical Advice/Left Without Being Seen/Elopement   Consults:  Cardiology     Significant Diagnostic Studies:  Ct Abdomen Pelvis Wo Contrast  Result Date: 09/19/2016 CLINICAL DATA:  End-stage renal disease.  Vomiting.  Diarrhea. EXAM: CT ABDOMEN AND PELVIS WITHOUT CONTRAST TECHNIQUE: Multidetector CT imaging of the abdomen and pelvis was performed following the standard protocol without IV contrast. COMPARISON:  None. FINDINGS: Lower chest: Patchy opacity and mild bullous changes at both lung bases. No pleural fluid. Hepatobiliary: Probable sludge in the gallbladder. Unremarkable liver. Pancreas: Unremarkable. No pancreatic ductal dilatation or surrounding inflammatory changes. Spleen: Normal in size without focal abnormality. Adrenals/Urinary Tract: Normal appearing adrenal glands. Very small kidneys. No significant urine in the urinary bladder. No urinary tract calculi or hydronephrosis seen. Stomach/Bowel: Poorly distended stomach with diffuse wall thickening. There is also diffuse wall thickening involving proximal small bowel loops. These are suboptimally evaluated due to the lack of intravenous and oral contrast. The distal small bowel and colon are grossly unremarkable. Multiple appendicoliths with no evidence of appendicitis. Vascular/Lymphatic: Extensive arterial calcifications, including the abdominal aorta and and its branches as well as the coronary arteries. Enlarged heart. No enlarged lymph nodes. Reproductive: Prostate is unremarkable. Other: None. Musculoskeletal: Patchy lucency and sclerosis throughout the bony skeleton. Mild compression deformities of the L5, T11, T10 and T9 vertebral bodies. No acute fracture lines or bony retropulsion. IMPRESSION: 1. Diffuse wall thickening involving the stomach and proximal small bowel, most likely due to gastroenteritis. 2. Extensive pneumonia or pulmonary edema at both lung bases with underlying  changes of COPD. 3. Marked cardiomegaly. 4. Marked diffuse atherosclerotic calcifications, including aortic atherosclerosis and coronary artery atherosclerosis. 5. Severe bilateral renal atrophy. 6. 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 Associated multiple mild vertebral compression deformities. Electronically Signed   By: Claudie Revering M.D.   On: 09/19/2016 09:27   Dg Chest Portable 1 View  Result Date: 09/19/2016 CLINICAL DATA:  Shortness of breath and diaphoresis for a few hours. History CHF, end-stage renal disease on dialysis. EXAM: PORTABLE CHEST 1 VIEW COMPARISON:  Chest radiograph November 14, 2014 FINDINGS: The cardiac silhouette is moderate to severely enlarged, unchanged. Bilateral diffuse alveolar airspace opacities. No pleural effusion. No pneumothorax. LEFT subclavian stent. 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 IMPRESSION: Diffuse alveolar airspace opacities in background of cardiomegaly most compatible with pulmonary edema though, infection could have a similar appearance. Electronically Signed   By: Elon Alas M.D.   On: 09/19/2016 05:31       Filed Weights   09/19/16 1343 09/19/16 1747 09/20/16 0446  Weight: 57.4 kg (126 lb 8.7 oz) 56.9 kg (125 lb 7.1 oz) 57.5 kg (126 lb 11.2 oz)     Microbiology: Recent Results (from the past 240 hour(s))  MRSA PCR Screening     Status: None   Collection Time: 09/19/16  9:54 AM  Result Value Ref Range Status   MRSA by PCR  NEGATIVE NEGATIVE Final    Comment:        The GeneXpert MRSA Assay (FDA approved for NASAL specimens only), is one component of a comprehensive MRSA colonization surveillance program. It is not intended to diagnose MRSA infection nor to guide or monitor treatment for MRSA infections.        Blood Culture No results found for: SDES, SPECREQUEST, CULT, REPTSTATUS    Labs: Results for orders placed or performed  during the hospital encounter of 09/19/16 (from the past 48 hour(s))  CBC     Status: Abnormal   Collection Time: 09/19/16  4:08 PM  Result Value Ref Range   WBC 3.6 (L) 4.0 - 10.5 K/uL   RBC 2.94 (L) 4.22 - 5.81 MIL/uL   Hemoglobin 8.7 (L) 13.0 - 17.0 g/dL    Comment: REPEATED TO VERIFY   HCT 27.8 (L) 39.0 - 52.0 %   MCV 94.6 78.0 - 100.0 fL   MCH 29.6 26.0 - 34.0 pg   MCHC 31.3 30.0 - 36.0 g/dL   RDW 16.0 (H) 11.5 - 15.5 %   Platelets 127 (L) 150 - 400 K/uL  PTH, intact and calcium     Status: Abnormal   Collection Time: 09/19/16  4:08 PM  Result Value Ref Range   PTH 1,452 (H) 15 - 65 pg/mL   Calcium, Total (PTH) 8.6 (L) 8.7 - 10.2 mg/dL   PTH Comment     Comment: (NOTE) Interpretation                 Intact PTH    Calcium                                (pg/mL)      (mg/dL) Normal                          15 - 65     8.6 - 10.2 Primary Hyperparathyroidism         >65          >10.2 Secondary Hyperparathyroidism       >65          <10.2 Non-Parathyroid Hypercalcemia       <65          >10.2 Hypoparathyroidism                  <15          < 8.6 Non-Parathyroid Hypocalcemia    15 - 65          < 8.6 Performed At: [facility] [address] [place], [place] [id number] Lindon Romp MD QB:[id number]   Creatinine, serum     Status: Abnormal   Collection Time: 09/19/16  4:19 PM  Result Value Ref Range   Creatinine, Ser 3.51 (H) 0.61 - 1.24 mg/dL    Comment: DELTA CHECK NOTED   GFR calc non Af Amer 21 (L) >60 mL/min   GFR calc Af Amer 25 (L) >60 mL/min    Comment: (NOTE) The eGFR has been calculated using the CKD EPI equation. This calculation has not been validated in all clinical situations. eGFR's persistently <60 mL/min signify possible Chronic Kidney Disease.   Troponin I     Status: Abnormal   Collection Time: 09/19/16  4:19 PM  Result Value Ref Range   Troponin I 0.94 (HH) <0.03 ng/mL  Comment: CRITICAL RESULT CALLED TO, READ BACK BY AND VERIFIED  WITH: C.HASSELL,RN 09/19/16 @1712  BY V.WILKINS   Phosphorus     Status: None   Collection Time: 09/19/16  4:20 PM  Result Value Ref Range   Phosphorus 3.3 2.5 - 4.6 mg/dL  Heparin level (unfractionated)     Status: Abnormal   Collection Time: 09/19/16  7:33 PM  Result Value Ref Range   Heparin Unfractionated <0.10 (L) 0.30 - 0.70 IU/mL    Comment:        IF HEPARIN RESULTS ARE BELOW EXPECTED VALUES, AND PATIENT DOSAGE HAS BEEN CONFIRMED, SUGGEST FOLLOW UP TESTING OF ANTITHROMBIN III LEVELS.   Troponin I     Status: Abnormal   Collection Time: 09/19/16  9:09 PM  Result Value Ref Range   Troponin I 0.80 (HH) <0.03 ng/mL    Comment: CRITICAL VALUE NOTED.  VALUE IS CONSISTENT WITH PREVIOUSLY REPORTED AND CALLED VALUE.  Comprehensive metabolic panel     Status: Abnormal   Collection Time: 09/20/16  3:56 AM  Result Value Ref Range   Sodium 136 135 - 145 mmol/L   Potassium 3.9 3.5 - 5.1 mmol/L   Chloride 92 (L) 101 - 111 mmol/L   CO2 25 22 - 32 mmol/L   Glucose, Bld 79 65 - 99 mg/dL   BUN 14 6 - 20 mg/dL   Creatinine, Ser 4.63 (H) 0.61 - 1.24 mg/dL   Calcium 9.3 8.9 - 10.3 mg/dL   Total Protein 7.2 6.5 - 8.1 g/dL   Albumin 3.3 (L) 3.5 - 5.0 g/dL   AST 18 15 - 41 U/L   ALT 9 (L) 17 - 63 U/L   Alkaline Phosphatase 1,421 (H) 38 - 126 U/L   Total Bilirubin 0.9 0.3 - 1.2 mg/dL   GFR calc non Af Amer 15 (L) >60 mL/min   GFR calc Af Amer 18 (L) >60 mL/min    Comment: (NOTE) The eGFR has been calculated using the CKD EPI equation. This calculation has not been validated in all clinical situations. eGFR's persistently <60 mL/min signify possible Chronic Kidney Disease.    Anion gap 19 (H) 5 - 15  Heparin level (unfractionated)     Status: Abnormal   Collection Time: 09/20/16  4:14 AM  Result Value Ref Range   Heparin Unfractionated <0.10 (L) 0.30 - 0.70 IU/mL    Comment:        IF HEPARIN RESULTS ARE BELOW EXPECTED VALUES, AND PATIENT DOSAGE HAS BEEN CONFIRMED, SUGGEST FOLLOW  UP TESTING OF ANTITHROMBIN III LEVELS. REPEATED TO VERIFY   CBC     Status: Abnormal   Collection Time: 09/20/16  8:33 AM  Result Value Ref Range   WBC 4.0 4.0 - 10.5 K/uL   RBC 2.83 (L) 4.22 - 5.81 MIL/uL   Hemoglobin 7.5 (L) 13.0 - 17.0 g/dL   HCT 27.9 (L) 39.0 - 52.0 %   MCV 98.6 78.0 - 100.0 fL   MCH 26.5 26.0 - 34.0 pg   MCHC 26.9 (L) 30.0 - 36.0 g/dL   RDW 16.2 (H) 11.5 - 15.5 %   Platelets 107 (L) 150 - 400 K/uL    Comment: REPEATED TO VERIFY PLATELET COUNT CONFIRMED BY SMEAR      Lipid Panel  No results found for: CHOL, TRIG, HDL, CHOLHDL, VLDL, LDLCALC, LDLDIRECT   Lab Results  Component Value Date   HGBA1C 5.3 11/15/2014        HPI :  30 year old male with a history of end-stage  renal disease, hypertension, SVC syndrome, OSA, polysubstance abuse in the past, smoker who apparently quit last month severe coronary artery disease who presents for chest pain. He is a poor historian and records have been obtained from [facility] this afternoon from where he was discharged on 12/12. Per records, he presented there on 12/9 with chest pain "similar to prior MI" unrelieved by nitroglycerin. He has known severe coronary artery disease with diffuse LAD disease, 90% mid LAD lesion, 60% mid RCA stenosis and 90% proximal posterior lateral stenosis. EF is 45%. He had an end STEMI with a troponin that peaked at 13.8. According to notes he is intermittently compliant with medication. Also according to this note, he declined further interventions  .   HOSPITAL COURSE: *     Chest pain history of severe coronary disease based on cardiac catheterization in May 2017 at [facility]. At that time he had a diffusely diseased LAD with 90% sequential mid LAD lesions, and 60% RCA and 90% proximal posterior lateral stenosis. Patient felt not to have good targets for any artery bypass and graft. Given history of noncompliance with medications and follow-up stents were not placed but could be  considered in the future if he demonstrated compliance. Readmitted early December with non-ST elevation myocardial infarction. Echocardiogram showed ejection fraction 45-50%, small echo dense lesion on the mitral valve, moderate left atrial enlargement and mild right atrial enlargement. Patient readmitted with chest pain 12/16. Troponin trended down  1.03-->0.94-->08. He was admitted to [facility] 09/12/16 with non-STEMI. At that time his troponin was peaked to 13.4. This is clearly indicates that troponin is trending down since thatadmission.He again declines any further procedure or stress test CT consistent with extensive pneumonia/pulmonary edema in both lung bases based on CT scan on 09/19/16. Suspect pulmonary edema Patient noncompliant with all of his discharge medications- amlodipine 10 mg, Coreg 57m twice a day, Imdur 120 mg daily, Brilinta 90 mg twice a day, lisinopril 20 mg daily, Lipitor 80 mg daily  He continues to be noncompliance with medications at times as well as follow-up in clinic Cardiology explained to him risk of myocardial infarction and death with untreated coronary disease. Offered to follow-up with Dr. CStanford Breed in the office in 4-6 weeks Patient left AMA without discharge paperwork, once we refused to offer him pain medicines  Chronic systolic heart failure - Echo 09/14/16-  'EF 45-50%, LVH is mild to moderate-anterior leaflet chordal calcification-small echodense structure with independent motion which may represent a subacute or acute vegetation-clinical correlation requested, dilated left atrium' -Fluid management with dialysis Patient refused 2-D echo     ESRD on hemodialysis -Dr. SJoelyn Omsaware of admission . Hemodialysis Tuesday Thursday Saturday, LUE AVG  Anemia of chronic disease May need transfusion in the near future hemoglobin trending down without any active signs of bleeding  Intermittent thrombocytopenia Previously platelets have been 113 to 130 range,  now 107  SVC syndrome,The left subclavian and innominate vein stenoses responded well to 14 mm balloon angioplasty  11/1214. -Face is swollen, speech is difficult to understand -Brachiocephalic stent?- venoplasty done by IR on 09/11/16 as mentioned in notes from [facility] to find any more information regarding this   [facility] -in the hospital he was demanding a regular diet rather than a renal diet- no other compliance issues noted in the hospital    Tobacco abuse - Apparently has quit    Nausea and vomiting CT  abdomen pelvis 09/19/16 showed possible gastroenteritis Currently on IV Protonix, change to PO    Diarrhea -According to patient this has also resolved      OSA on CPAP   Acute pulmonary edema [facility])    Discharge Exam:   Blood pressure 137/79, pulse 74, temperature 97.8 F (36.6 C), temperature source Oral, resp. rate 18, height 5' (1.524 m), weight 57.5 kg (126 lb 11.2 oz), SpO2 100 %.        SignedReyne Dumas 09/21/2016, 3:40 PM        Time spent >45 mins

## 2016-09-21 NOTE — Progress Notes (Signed)
PROGRESS NOTE    Alexander RiggRichard [name]  WUJ:[id number] DOB: 10/31/1985 DOA: 09/19/2016  PCP: No PCP Per Patient   Brief Narrative:  30 year old male with a history of end-stage renal disease, hypertension, SVC syndrome, OSA, polysubstance abuse in the past, smoker who apparently quit last month severe coronary artery disease who presents for chest pain. He is a poor historian and [facility] where he was discharged on 12/12. Per records, he presented there on 12/9 with chest pain "similar to prior MI" unrelieved by nitroglycerin. He has known severe coronary artery disease with diffuse LAD disease, 90% mid LAD lesion, 60% mid RCA stenosis and 90% proximal posterior lateral stenosis. EF is 45%. He had an end STEMI with a troponin that peaked at 13.8. According to notes he is intermittently compliant with medication. Also according to this note, he declined further interventions however the patient tells me that this is not true. At this point he states he is willing to undergo an intervention if needed.  .  Subjective:  chest pain  on the left side of his chest  severe shortness of breath when he tries to ambulate with exacerbation of his pain.    Assessment & Plan:       Chest pain -As mentioned above, recent  NSTEMI with history of diffuse disease-echo did not show wall motion abnormalities Cardiology consulted 12/17,  Pending down  1.03-->0.94-->08. [facility] with non-STEMI. At that time his troponin was peaked to 13.4. This is clearly indicates that troponin is trending down since that admission.He again declines any further procedure or stress test -Aspirin, Coreg, isosorbide dinitrate, morphine when necessary CT consistent with extensive pneumonia/pulmonary edema in both lung bases based on CT scan on 09/19/16. Suspect pulmonary edema Continue amlodipine 10 mg, Coreg 25 mg twice a day, Imdur 120 mg daily, Brilinta 90 mg  twice a day, lisinopril 20 mg daily, Lipitor 80 mg daily   Chronic systolic heart failure - Echo 95/62/1311/08/22-  'EF 45-50%, LVH is mild to moderate-anterior leaflet chordal calcification-small echodense structure with independent motion which may represent a subacute or acute vegetation-clinical correlation requested, dilated left atrium' -Fluid management with dialysis -we'll repeat echo . to reassess the "vegetation" mentioned in prior echo     ESRD on hemodialysis -Dr. [facility] aware of admission . Hemodialysis Tuesday Thursday Saturday, LUE AVG  Anemia of chronic disease May need transfusion, will order 1 unit of packed red blood cells with dialysis  Intermittent thrombocytopenia Previously platelets have been 113 to 130 range, now 107  SVC syndrome,The left subclavian and innominate vein stenoses responded well to 14 mm balloon angioplasty  11/1214. -Face is swollen, speech is difficult to understand -Brachiocephalic stent?- venoplasty done by IR on 09/11/16 as mentioned in notes from [facility]-am unable to find any more information regarding this   Medical Compliance issues -[facility] mentions he is not always compliant medication -in the hospital he was demanding a regular diet rather than a renal diet- no other compliance issues noted in the hospital    Tobacco abuse - Apparently has quit    Nausea and vomiting CT abdomen pelvis 09/19/16 showed possible gastroenteritis Currently on IV Protonix, change to PO    Diarrhea -According to patient this has also resolved      OSA on CPAP   Acute pulmonary edema (HCC)   DVT prophylaxis: Heparin Code Status: Full code Family Communication:  Disposition Plan: Home  when stable   Consultants:  Cardiology Nephrology  Procedures:    Antimicrobials:  Anti-infectives    None       Objective: Vitals:   09/20/16 2341 09/21/16 0426 09/21/16 0753 09/21/16 0854  BP: (!) 105/49 (!) 94/49 101/61 110/64    Pulse: 75 74 71   Resp:   16   Temp: 97.9 F (36.6 C) 97.9 F (36.6 C) 98 F (36.7 C)   TempSrc: Oral Oral Oral   SpO2: 99% 99% 100%   Weight:      Height:  5' (1.524 m)      Intake/Output Summary (Last 24 hours) at 09/21/16 1000 Last data filed at 09/21/16 0900  Gross per 24 hour  Intake              444 ml  Output                0 ml  Net              444 ml   Filed Weights   09/19/16 1343 09/19/16 1747 09/20/16 0446  Weight: 57.4 kg (126 lb 8.7 oz) 56.9 kg (125 lb 7.1 oz) 57.5 kg (126 lb 11.2 oz)    Examination: General exam: Appears comfortable  HEENT: PERRLA, oral mucosa moist, no sclera icterus or thrush- face and tongue swollen-speech difficult to comprehend Respiratory system: Clear to auscultation. Respiratory effort normal. Cardiovascular system: S1 & S2 heard, RRR.  No murmurs  Gastrointestinal system: Abdomen soft, non-tender, nondistended. Normal bowel sound. No organomegaly Central nervous system: Alert and oriented. No focal neurological deficits. Extremities: No cyanosis, clubbing or edema Skin: No rashes or ulcers Psychiatry:  Mood & affect appropriate.     Data Reviewed: I have personally reviewed following labs and imaging studies  CBC:  Recent Labs Lab 09/19/16 0428 09/19/16 0451 09/19/16 1608 09/20/16 0833  WBC 6.5  --  3.6* 4.0  HGB 10.5* 11.6* 8.7* 7.5*  HCT 33.3* 34.0* 27.8* 27.9*  MCV 95.4  --  94.6 98.6  PLT 155  --  127* 107*   Basic Metabolic Panel:  Recent Labs Lab 09/19/16 0428 09/19/16 0451 09/19/16 1608 09/19/16 1619 09/19/16 1620 09/20/16 0356  NA 137 137  --   --   --  136  K 3.8 3.8  --   --   --  3.9  CL 91* 96*  --   --   --  92*  CO2 25  --   --   --   --  25  GLUCOSE 103* 103*  --   --   --  79  BUN 30* 34*  --   --   --  14  CREATININE 7.22* 7.40*  --  3.51*  --  4.63*  CALCIUM 9.7  --  8.6*  --   --  9.3  PHOS  --   --   --   --  3.3  --    GFR: Estimated Creatinine Clearance: 16.2 mL/min (by C-G  formula based on SCr of 4.63 mg/dL (H)). Liver Function Tests:  Recent Labs Lab 09/19/16 0428 09/20/16 0356  AST 21 18  ALT 10* 9*  ALKPHOS 1,585* 1,421*  BILITOT 1.1 0.9  PROT 8.5* 7.2  ALBUMIN 4.1 3.3*   No results for input(s): LIPASE, AMYLASE in the last 168 hours. No results for input(s): AMMONIA in the last 168 hours. Coagulation Profile: No results for input(s): INR, PROTIME in the last 168 hours. Cardiac Enzymes:  Recent Labs Lab 09/19/16 1619 09/19/16 2109  TROPONINI 0.94* 0.80*   BNP (last 3 results) No results for input(s): PROBNP in the last 8760 hours. HbA1C: No results for input(s): HGBA1C in the last 72 hours. CBG: No results for input(s): GLUCAP in the last 168 hours. Lipid Profile: No results for input(s): CHOL, HDL, LDLCALC, TRIG, CHOLHDL, LDLDIRECT in the last 72 hours. Thyroid Function Tests: No results for input(s): TSH, T4TOTAL, FREET4, T3FREE, THYROIDAB in the last 72 hours. Anemia Panel: No results for input(s): VITAMINB12, FOLATE, FERRITIN, TIBC, IRON, RETICCTPCT in the last 72 hours. Urine analysis: No results found for: COLORURINE, APPEARANCEUR, LABSPEC, PHURINE, GLUCOSEU, HGBUR, BILIRUBINUR, KETONESUR, PROTEINUR, UROBILINOGEN, NITRITE, LEUKOCYTESUR Sepsis Labs: @LABRCNTIP (procalcitonin:4,lacticidven:4) ) Recent Results (from the past 240 hour(s))  MRSA PCR Screening     Status: None   Collection Time: 09/19/16  9:54 AM  Result Value Ref Range Status   MRSA by PCR NEGATIVE NEGATIVE Final    Comment:        The GeneXpert MRSA Assay (FDA approved for NASAL specimens only), is one component of a comprehensive MRSA colonization surveillance program. It is not intended to diagnose MRSA infection nor to guide or monitor treatment for MRSA infections.          Radiology Studies: No results found.    Scheduled Meds: . aspirin EC  81 mg Oral Daily  . carvedilol  25 mg Oral BID WC  . cinacalcet  60 mg Oral Q breakfast  .  diclofenac sodium  2 g Topical QID  . diphenhydrAMINE  25 mg Oral Once  . famotidine  20 mg Oral Daily  . hydrALAZINE  50 mg Oral BID  . isosorbide dinitrate  40 mg Oral BID  . NIFEdipine  60 mg Oral Daily  . pantoprazole (PROTONIX) IV  40 mg Intravenous Q12H  . sevelamer carbonate  800 mg Oral TID WC  . sodium chloride flush  3 mL Intravenous Q12H   Continuous Infusions:   LOS: 2 days    Time spent in minutes: 35    Cait Locust, MD [facility] Pager: [email or web address] Password [email or web address] 09/21/2016, 10:00 AM

## 2016-09-21 NOTE — CV Procedure (Signed)
Echo attempted but pt leaving AMA

## 2016-09-21 NOTE — Progress Notes (Signed)
Pt has requested to be discharged right now and have his prescriptions for pain medicine provided to him since they are not going to do any procedure on him. I paged MD and pt was told that he needs to stay until his follow up medications and new regiment is finalized. He also was told that he need to contact his PCP for any pain medication order/ refill. Pt refused to wait and signed the AMA form. IV and tele box removed. Pt walked ou the unit.  Colleen Canesar Rayette Mogg, RN

## 2016-09-21 NOTE — Progress Notes (Signed)
Updated report received in patient's room via Rey RN, reviewed new orders, POC, VS and events of the day, assumed care of patient.

## 2016-09-21 NOTE — Clinical Social Work Note (Signed)
Clinical Social Work Assessment  Patient Details  Name: Alexander [name] MRN: [id number] Date of Birth: 1985/11/21  Date of referral:  09/21/16               Reason for consult:  Substance Use/ETOH Abuse, Discharge Planning                Permission sought to share information with:    Permission granted to share information::  No  Name::        Agency::     Relationship::     Contact Information:     Housing/Transportation Living arrangements for the past 2 months:  Apartment Source of Information:  Patient Patient Interpreter Needed:  None Criminal Activity/Legal Involvement Pertinent to Current Situation/Hospitalization:  No - Comment as needed Significant Relationships:  Siblings Lives with:  Siblings Do you feel safe going back to the place where you live?  Yes Need for family participation in patient care:  No (Coment)  Care giving concerns:  The patient does not report any care giving concerns. He states he just wants to go home.  Social Worker assessment / plan:  CSW met with the patient at bedside to complete assessment. The patient states that he recently moved here to stay with his brother as he was having "issues" at home in [place]. The patient states "I am trying to get everything switched to [place], like my dialysis and pain medications." The patient has questions about getting his medications through his medicaid when he leaves the hospital. Appropriate RNCM notified. CSW assessed the patient's substance abuse concerns and provided the patient with appropriate outpatient and residential treatment options. The patient has a substance abuse history significant for marijuani and cocaine use. The patient appeared very concerned about where and when he would get his pain medications. CSW will sign off at this time as the patient does not present with any other CSW need at this time.   Employment status:  Disabled (Comment on whether or not currently receiving  Disability) Insurance information:  Medicaid In [place] PT Recommendations:  Not assessed at this time Information / Referral to community resources:  Outpatient Substance Abuse Treatment Options, Residential Substance Abuse Treatment Options, SBIRT  Patient/Family's Response to care:  The patient appears anxious to leave the hospital. He keeps asking when he can leave.  Patient/Family's Understanding of and Emotional Response to Diagnosis, Current Treatment, and Prognosis:  The patient appears to have a fair understanding of the reason for his admission and his post DC needs.   Emotional Assessment Appearance:  Appears stated age Attitude/Demeanor/Rapport:  Other (The patient is appropriate and welcoming of CSW.) Affect (typically observed):  Accepting, Appropriate, Calm, Pleasant Orientation:  Oriented to Self, Oriented to Place, Oriented to  Time, Oriented to Situation Alcohol / Substance use:  Illicit Drugs (Reports that he uses marijuana and used to use cocaine) Psych involvement (Current and /or in the community):  No (Comment)  Discharge Needs  Concerns to be addressed:  Substance Abuse Concerns Readmission within the last 30 days:  No Current discharge risk:  Chronically ill Barriers to Discharge:  Continued Medical Work up   Rigoberto Noel, LCSW 09/21/2016, 2:42 PM

## 2016-09-21 NOTE — Consult Note (Addendum)
CARDIOLOGY CONSULT NOTE   Patient ID: Alexander [name] MRN: [id number] DOB/AGE: 32/11/1985 32 y.o.  Admit date: 09/19/2016  Primary Physician   No PCP Per Patient Primary Cardiologist   New Reason for Consultation   Elevated troponin Requesting Physician  Dr. Susie CassetteAbrol  HPI: Alexander [name] is a 30 y.o. male with a history of CAD, recent NSTMI, SVC syndrome, polysubstance abuse, secondary parathyroidism, tobacco abuse ESRD on HD T, TH,Sat (he does not make any urine) and HTN who presented to [facility] for evaluation of chest pain.  Patient admitted 09/19/16 for chest pain. He has been having chest pain for the past 2 days prior to admission. He says chest pain started during dialysis on Thursday. It was intermittent. He described the pain as a achy pressure with shortness of breath. Mostly occurs with exertion. Patient states that he was [facility] about a month ago and had a cardiac catheterization which was normal. Per ER note cath was done December this year. Unable to find records in care everywhere. Per note of [facility], multiple attempts to obtain records. Spoken with charge nurse, she will work on obtaining records. No reoccurrence of chest pain since admission.  Dr. Katrinka BlazingSmith advice during admission that continue cycle troponin and consult cardiology if trend goes up. Troponin trending down 1.03-->0.94-->08. Hgb of 7.5. EKG shows sinus rhythm at rate of 73 bpm, LVH. No acute changes. Chest pain free since admission. Wants to go home.   [facility] as below: Patient was admitted 09/12/16  To 09/15/16 for NSTEMI with peak of troponin to 13.8. Pt has known severe CAD with diffusely diseased LAD with sequential (at least 4)90% mid LAD lesions, 60% mid RCA stenosis and 90% proximal posterior lateral stenosis as cath below. He had severe coronary artery disease that would require rotational atherectomy and a long stent length. He had difficutly with his prior cath and is  intermittently compliant with medications. His pain initially managed with nitroglycerin drip, discontinued due to severe headaches and improvement in chest pain. Loaded with Brilliant. Stopped Plavix. Echo showed Lv EF of 45-50%. Medical management. Patient does not interested in any further interventional procedure.  Echo 09/13/16 Limited study to evaluate ventricular function  Mildly decreased left ventricular systolic function, ejection fraction  45 to 50%  Left ventricular hypertrophy - mild to moderate  Degenerative mitral valve disease  Mitral annular calcification  Anterior leaflet chordal calcification is a small echodense structure  with independent motion which may represent a subacute or acute  vegetation. Clinical correlation is required. A transesophageal  echocardiogram may help to better characterize this structure.  Dilated left atrium - moderate  Aortic sclerosis  Normal right ventricular systolic function  Dilated right atrium - mild  Echo 09/07/16 Left ventricular hypertrophy - mild  Mildly decreased left ventricular systolic function, ejection fraction  45 to 50%  Aortic sclerosis  Degenerative mitral valve disease  Mitral annular calcification  Dilated left atrium - mild  Dilated right ventricle  Normal right ventricular systolic function  Elevated pulmonary artery systolic pressure - moderate  Dilated right atrium - mild  Cath 02/10/16 Cardiac C[facility]terization [facility] [place], [place] Tel: [phone]  Fax: [phone]) [phone]  FINAL CARDIAC CATHETERIZATION REPORT ___________________________________________________________________________ _ Findings: 1. Severe heavily calcified oronary artery disease (as described below)  including a diffusely diseased LAD with sequential 90% mid LAD lesions,  60% mid RCA stenosis and 90% proximal posterior lateral stenosis  2. Normal  ventricular filling pressures (LVEPD =  11 mm Hg). 3. Low normal left ventricular contractile function (estimated LVEF = 50  %).  Plan: 1. Start dual antiplatelet therapy 2. ASA, Statin, Bblocker,  3. If he establishes regular f/u and medical compliance, could consider  high risk PCI to LAD (would require general anesthesia due to sedation  requirements) 4. Reviewed films with Dr. Sindy Guadeloupearanasos, he does not have good surgical  targets  Complications:  None Past Medical History:  Diagnosis Date  . ESRD on hemodialysis 11/13/2014   Per patient had childhood kidney disease, not sure etiology.  Around age 30 was started on RRT with PD for a short time, then changed to HD which he says he has been doing for about 16 years.  Has a LUA AVF as of Feb 2016.     Marland Kitchen. Hypertension      History reviewed. No pertinent surgical history.  No Known Allergies  I have reviewed the patient's current medications . aspirin EC  81 mg Oral Daily  . carvedilol  25 mg Oral BID WC  . cinacalcet  60 mg Oral Q breakfast  . diclofenac sodium  2 g Topical QID  . diphenhydrAMINE  25 mg Oral Once  . famotidine  20 mg Oral Daily  . hydrALAZINE  50 mg Oral BID  . isosorbide dinitrate  40 mg Oral BID  . NIFEdipine  60 mg Oral Daily  . pantoprazole  40 mg Oral BID  . sevelamer carbonate  800 mg Oral TID WC  . sodium chloride flush  3 mL Intravenous Q12H    diphenhydrAMINE, morphine injection, nitroGLYCERIN, ondansetron **OR** ondansetron (ZOFRAN) IV  Prior to Admission medications   Medication Sig Start Date End Date Taking? Authorizing Provider  carvedilol (COREG) 25 MG tablet Take 25 mg by mouth 2 (two) times daily with a meal.   Yes Historical Provider, MD  cinacalcet (SENSIPAR) 60 MG tablet Take 60 mg by mouth daily.   Yes Historical Provider, MD  famotidine (PEPCID) 20 MG tablet Take 20 mg by mouth daily.    Yes Historical Provider, MD  hydrALAZINE (APRESOLINE) 50 MG tablet Take 50 mg by mouth 2 (two) times daily.   Yes Historical Provider,  MD  isosorbide dinitrate (ISORDIL) 40 MG tablet Take 40 mg by mouth 2 (two) times daily.   Yes Historical Provider, MD  metoCLOPramide (REGLAN) 5 MG tablet Take 5 mg by mouth 4 (four) times daily -  before meals and at bedtime.   Yes Historical Provider, MD  NIFEdipine (PROCARDIA XL/ADALAT-CC) 60 MG 24 hr tablet Take 60 mg by mouth daily.   Yes Historical Provider, MD  sevelamer (RENAGEL) 800 MG tablet Take 800 mg by mouth 3 (three) times daily with meals.   Yes Historical Provider, MD     Social History   Social History  . Marital status: Single    Spouse name: N/A  . Number of children: N/A  . Years of education: N/A   Occupational History  . Not on file.   Social History Main Topics  . Smoking status: Current Every Day Smoker    Packs/day: 0.50    Years: 5.00    Types: Cigarettes  . Smokeless tobacco: Never Used  . Alcohol use No  . Drug use:     Types: Marijuana     Comment: daily  . Sexual activity: Not on file   Other Topics Concern  . Not on file   Social History Narrative  . No  narrative on file    No family status information on file.   Family History  The patient's family history includes Diabetes in his maternal grandmother and mother; Heart disease in his maternal grandmother; Hypertension in his mother..  ROS:  Full 14 point review of systems complete and found to be negative unless listed above.  Physical Exam: Blood pressure 110/64, pulse 71, temperature 98 F (36.7 C), temperature source Oral, resp. rate 16, height 5' (1.524 m), weight 126 lb 11.2 oz (57.5 kg), SpO2 100 %.  General: Well developed, well nourished, male in no acute distress Head: Eyes PERRLA, No xanthomas. Normocephalic and atraumatic, oropharynx without edema or exudate.  Lungs: Resp regular and unlabored, CTA. Heart: RRR no s3, s4, or murmurs..   Neck: No carotid bruits. No lymphadenopathy.  No JVD. Abdomen: Bowel sounds present, abdomen soft and non-tender without masses or hernias  noted. Msk:  No spine or cva tenderness. No weakness, no joint deformities or effusions. Extremities: No clubbing, cyanosis or edema. DP/PT/Radials 2+ and equal bilaterally. Neuro: Alert and oriented X 3. No focal deficits noted. Psych:  Good affect, responds appropriately Skin: No rashes or lesions noted.  Labs:   Lab Results  Component Value Date   WBC 4.0 09/20/2016   HGB 7.5 (L) 09/20/2016   HCT 27.9 (L) 09/20/2016   MCV 98.6 09/20/2016   PLT 107 (L) 09/20/2016   No results for input(s): INR in the last 72 hours.  Recent Labs Lab 09/20/16 0356  NA 136  K 3.9  CL 92*  CO2 25  BUN 14  CREATININE 4.63*  CALCIUM 9.3  PROT 7.2  BILITOT 0.9  ALKPHOS 1,421*  ALT 9*  AST 18  GLUCOSE 79  ALBUMIN 3.3*   Magnesium  Date Value Ref Range Status  11/13/2014 2.0 1.5 - 2.5 mg/dL Final    Recent Labs  16/10/96 1619 09/19/16 2109  TROPONINI 0.94* 0.80*    Recent Labs  09/19/16 0424  TROPIPOC 1.03*   No results found for: PROBNP No results found for: CHOL, HDL, LDLCALC, TRIG No results found for: DDIMER No results found for: LIPASE, AMYLASE No results found for: TSH, T4TOTAL, T3FREE, THYROIDAB TIBC  Date/Time Value Ref Range Status  11/14/2014 04:22 PM 267 215 - 435 ug/dL Final   Iron  Date/Time Value Ref Range Status  11/14/2014 04:22 PM 51 42 - 165 ug/dL Final    Radiology:  No results found.  ASSESSMENT AND PLAN:     1. Elevated troponin - Pending down  1.03-->0.94-->08. He was admitted to [facility] 09/12/16 with non-STEMI. At that time his troponin was peaked to 13.4. This is clearly indicates that troponin is trending down since that admission. Also per discharge summary, "patient is not interested in any further interventional procedure, recommended medical therapy".  Patient has a history of severe CAD as noted above. He again declines any further procedure or stress test. Continue current medical therapy. Per discharge summary his on amlodipine 10 mg, Coreg  25 mg twice a day, Imdur 120 mg daily, Brilinta 90 mg twice a day, lisinopril 20 mg daily, Lipitor 80 mg daily. Please see discharge summary for details medication and consider resuming. MD to see later day. He can eat.   2. HTN - BP stable here.   SignedManson Passey, PA 09/21/2016, 10:21 AM Pager [phone]  Co-Sign MD  As above, patient seen and examined. Briefly he is a 30 year old male with past medical history of coronary artery disease, polysubstance abuse, tobacco abuse,  end-stage renal disease dialysis dependent for evaluation of non-ST elevation myocardial infarction. Patient has a history of severe coronary disease based on cardiac catheterization in May 2017 at [facility]. At that time he had a diffusely diseased LAD with 90% sequential mid LAD lesions, and 60% RCA and 90% proximal posterior lateral stenosis. Patient felt not to have good targets for any artery bypass and graft. Given history of noncompliance with medications and follow-up stents were not placed but could be considered in the future if he demonstrated compliance. Readmitted early December with non-ST elevation myocardial infarction. Echocardiogram showed ejection fraction 45-50%, small echo dense lesion on the mitral valve, moderate left atrial enlargement and mild right atrial enlargement. Patient readmitted with chest pain. He developed pain in his left upper chest with vigorous activities. It is relieved with rest. No radiation. No associated symptoms. He occasionally has pain at the end of dialysis. Cardiology asked to evaluate. Troponin is 0.94 and 0.80. Creatinine 4.63. Hemoglobin 7.5.    1 non-ST elevation myocardial infarction-patient has known severe coronary disease. Continue aspirin, brilinta, statin, nitrates and coreg. Long discussion with him today concerning his coronary disease. He was felt previously not a candidate for coronary artery bypass and graft. He continues to be noncompliance with medications at  times as well as follow-up in clinic. I have explained that if he had stents put in and did not take medications this would place him at risk for thrombosis and acute myocardial infarction. I would like for him to demonstrate compliance prior to considering this option. He also states he would not be agreeable to cardiac catheterization at this time. He understands the risk of myocardial infarction and death with untreated coronary disease. He can follow-up with me in the office in 4-6 weeks. We can consider catheterization in the future if he is compliant with follow-up and medications.  2 End-stage renal disease-managed by nephrology.  3 history of SVC syndrome   4 anemia-management per primary care.  5 tobacco abuse-patient counseled on discontinuing.  6 substance abuse-patient counseled on avoiding.   Olga Millers, MD

## 2017-10-05 DEATH — deceased

## 2018-07-12 IMAGING — CT CT ABD-PELV W/O CM
2 of 4 series · 4 of 46 positions shown, 6 images · non-contrast
Comparison: None.

CLINICAL DATA: End-stage renal disease.  Vomiting.  Diarrhea.

EXAM:
CT ABDOMEN AND PELVIS WITHOUT CONTRAST
TECHNIQUE: Multidetector CT imaging of the abdomen and pelvis was performed
following the standard protocol without IV contrast.

[Series 204: coronal · coronal · 0.45mm/px · 3 of 128 slices shown, 4 images]
[im 29/128  soft-tissue]
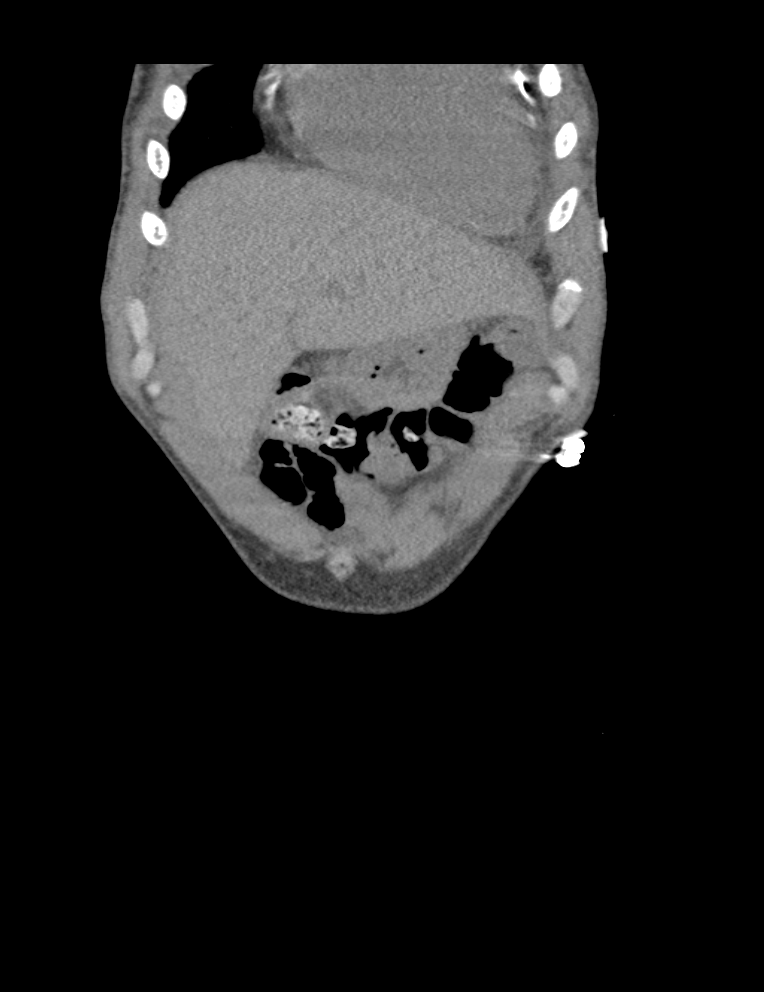
[im 29/128  bone]
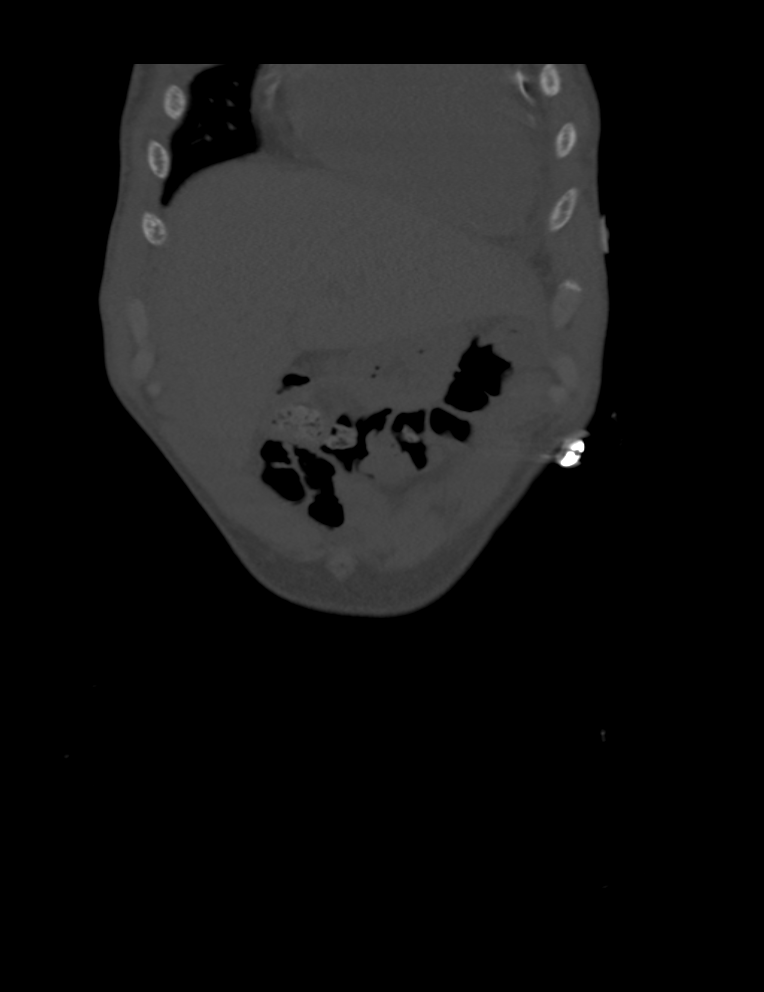
[im 71/128  soft-tissue]
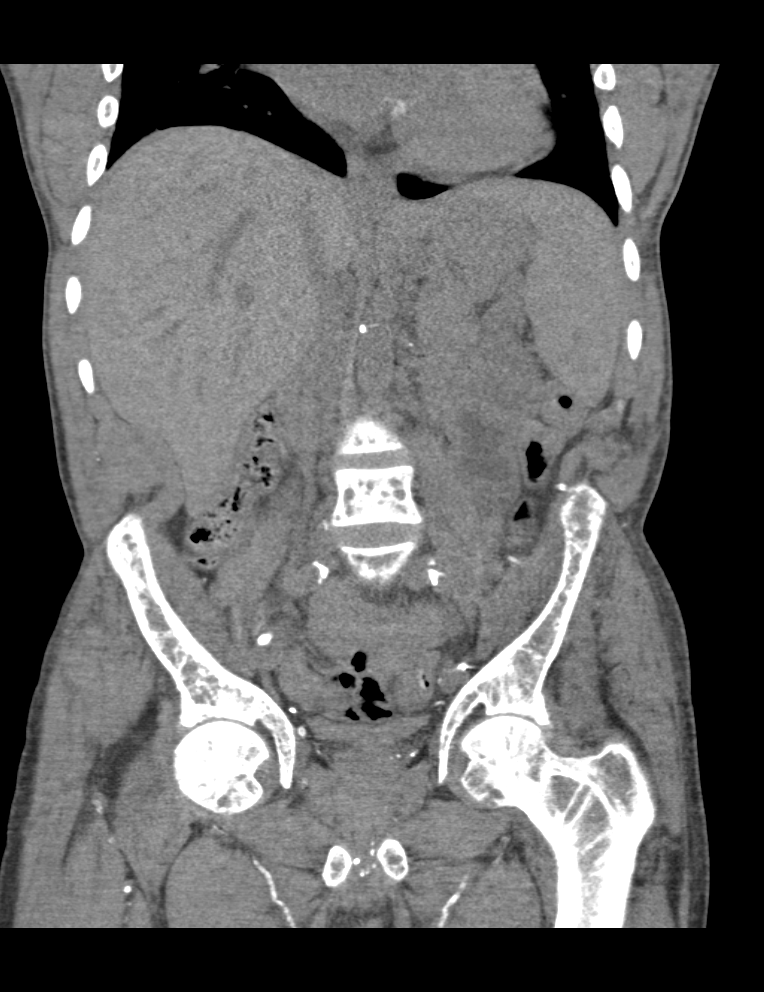
[im 99/128  soft-tissue]
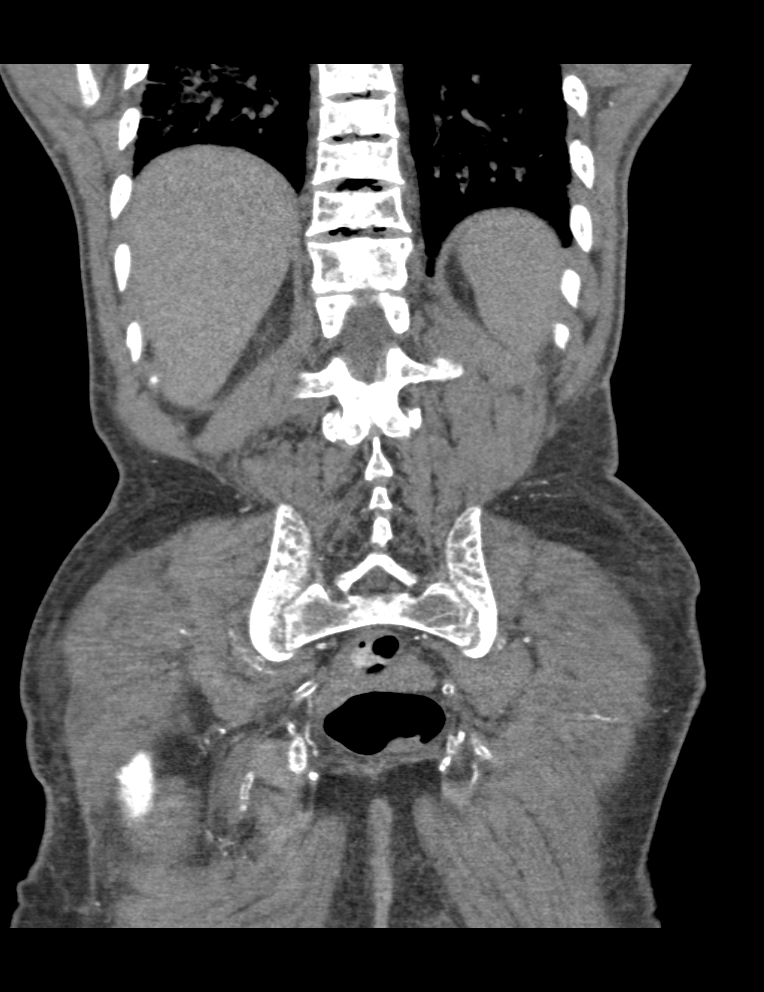

[Series 205: sagittal · sagittal · 0.45mm/px · 1 of 145 slices shown, 2 images]
[im 49/145  soft-tissue]
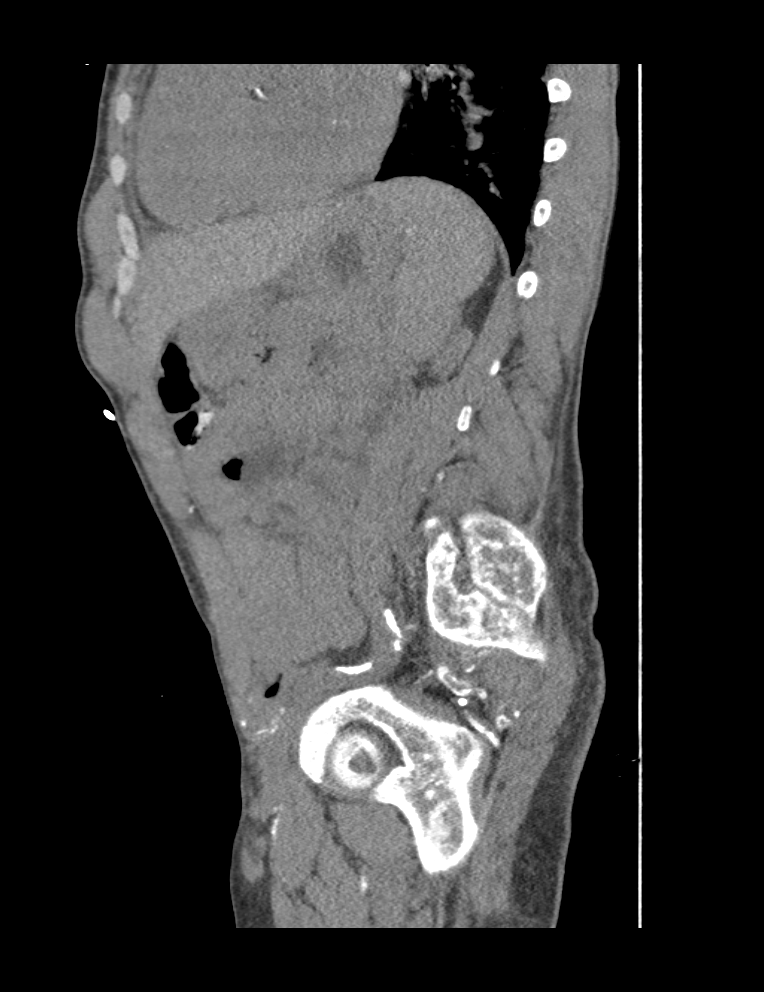
[im 49/145  bone]
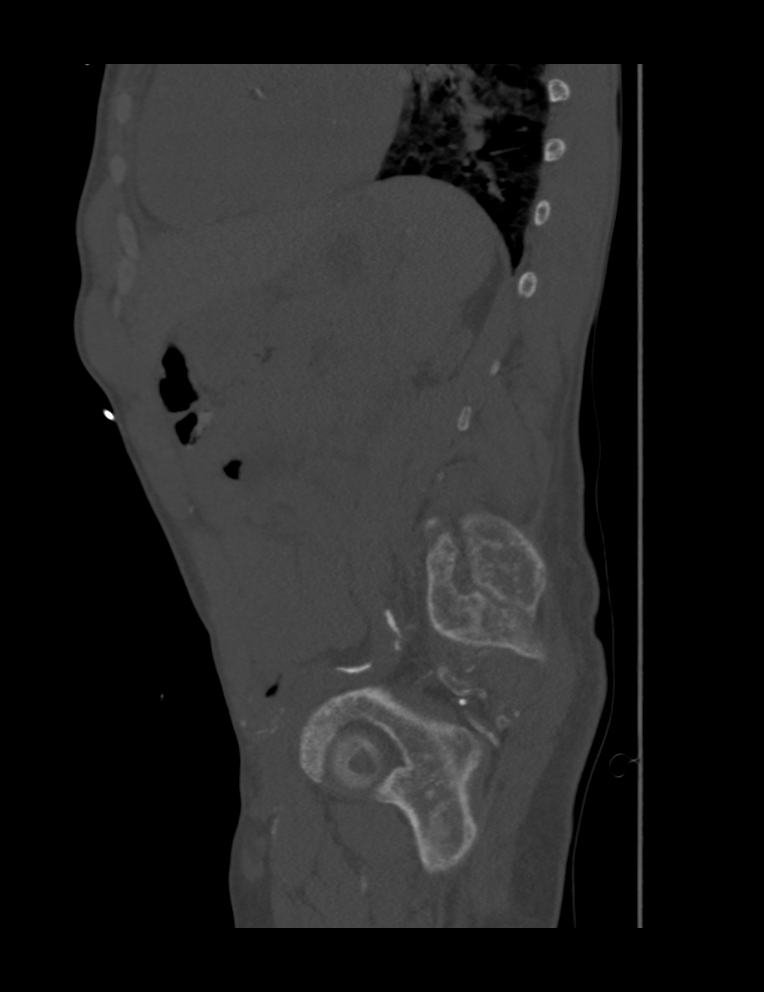

[4 of 46 positions shown; findings below may reference images not displayed]

FINDINGS: Lower chest: Patchy opacity and mild bullous changes at both lung
bases. No pleural fluid.

Hepatobiliary: Probable sludge in the gallbladder. Unremarkable
liver.

Pancreas: Unremarkable. No pancreatic ductal dilatation or
surrounding inflammatory changes.

Spleen: Normal in size without focal abnormality.

Adrenals/Urinary Tract: Normal appearing adrenal glands. Very small
kidneys. No significant urine in the urinary bladder. No urinary
tract calculi or hydronephrosis seen.

Stomach/Bowel: Poorly distended stomach with diffuse wall
thickening. There is also diffuse wall thickening involving proximal
small bowel loops. These are suboptimally evaluated due to the lack
of intravenous and oral contrast. The distal small bowel and colon
are grossly unremarkable. Multiple appendicoliths with no evidence
of appendicitis.

Vascular/Lymphatic: Extensive arterial calcifications, including the
abdominal aorta and and its branches as well as the coronary
arteries. Enlarged heart. No enlarged lymph nodes.

Reproductive: Prostate is unremarkable.

Other: None.

Musculoskeletal: Patchy lucency and sclerosis throughout the bony
skeleton. Mild compression deformities of the L5, T11, T10 and T9
vertebral bodies. No acute fracture lines or bony retropulsion.
IMPRESSION: 1. Diffuse wall thickening involving the stomach and proximal small
bowel, most likely due to gastroenteritis.
2. Extensive pneumonia or pulmonary edema at both lung bases with
underlying changes of COPD.
3. Marked cardiomegaly.
4. Marked diffuse atherosclerotic calcifications, including aortic
atherosclerosis and coronary artery atherosclerosis.
5. Severe bilateral renal atrophy.
6. Extensive changes of renal osteodystrophy throughout the bony
skeleton. Associated multiple mild vertebral compression
deformities.
